# Patient Record
Sex: Female | Born: 1985 | Race: White | Hispanic: No | Marital: Single | Smoking: Never smoker
Health system: Southern US, Community
[De-identification: ages and names within clinical notes are randomized; demographics above are authoritative.]

## PROBLEM LIST (undated history)

## (undated) DIAGNOSIS — I10 Essential (primary) hypertension: Secondary | ICD-10-CM

## (undated) DIAGNOSIS — F99 Mental disorder, not otherwise specified: Secondary | ICD-10-CM

## (undated) DIAGNOSIS — G709 Myoneural disorder, unspecified: Secondary | ICD-10-CM

## (undated) DIAGNOSIS — O24419 Gestational diabetes mellitus in pregnancy, unspecified control: Secondary | ICD-10-CM

## (undated) DIAGNOSIS — R569 Unspecified convulsions: Secondary | ICD-10-CM

## (undated) DIAGNOSIS — F329 Major depressive disorder, single episode, unspecified: Secondary | ICD-10-CM

## (undated) DIAGNOSIS — F419 Anxiety disorder, unspecified: Secondary | ICD-10-CM

## (undated) DIAGNOSIS — F32A Depression, unspecified: Secondary | ICD-10-CM

## (undated) HISTORY — PX: HEMORRHOIDECTOMY WITH HEMORRHOID BANDING: SHX5633

---

## 2012-07-02 ENCOUNTER — Other Ambulatory Visit (HOSPITAL_COMMUNITY): Payer: Self-pay | Admitting: Obstetrics and Gynecology

## 2012-07-24 ENCOUNTER — Ambulatory Visit (HOSPITAL_COMMUNITY): Payer: Self-pay

## 2012-07-24 ENCOUNTER — Other Ambulatory Visit (HOSPITAL_COMMUNITY): Payer: Self-pay

## 2012-07-24 ENCOUNTER — Encounter (HOSPITAL_COMMUNITY): Payer: Self-pay | Admitting: Obstetrics and Gynecology

## 2012-07-24 ENCOUNTER — Encounter (HOSPITAL_COMMUNITY): Payer: Self-pay

## 2012-07-27 ENCOUNTER — Other Ambulatory Visit (HOSPITAL_COMMUNITY): Payer: Self-pay

## 2012-07-27 ENCOUNTER — Encounter (HOSPITAL_COMMUNITY): Payer: Self-pay

## 2012-08-01 ENCOUNTER — Ambulatory Visit (HOSPITAL_COMMUNITY)
Admission: RE | Admit: 2012-08-01 | Discharge: 2012-08-01 | Disposition: A | Discharge status: Home / Self Care | Payer: Medicaid Other | Source: Ambulatory Visit | Attending: Obstetrics and Gynecology | Admitting: Obstetrics and Gynecology

## 2012-08-01 ENCOUNTER — Encounter (HOSPITAL_COMMUNITY): Payer: Self-pay

## 2012-08-01 VITALS — BP 138/92 | HR 129 | Wt 120.5 lb

## 2012-08-01 DIAGNOSIS — Z1389 Encounter for screening for other disorder: Secondary | ICD-10-CM | POA: Insufficient documentation

## 2012-08-01 DIAGNOSIS — Z363 Encounter for antenatal screening for malformations: Secondary | ICD-10-CM | POA: Insufficient documentation

## 2012-08-01 DIAGNOSIS — O358XX0 Maternal care for other (suspected) fetal abnormality and damage, not applicable or unspecified: Secondary | ICD-10-CM | POA: Insufficient documentation

## 2012-08-01 DIAGNOSIS — O10019 Pre-existing essential hypertension complicating pregnancy, unspecified trimester: Secondary | ICD-10-CM | POA: Insufficient documentation

## 2012-08-01 DIAGNOSIS — O352XX0 Maternal care for (suspected) hereditary disease in fetus, not applicable or unspecified: Secondary | ICD-10-CM | POA: Insufficient documentation

## 2012-08-01 DIAGNOSIS — Q8500 Neurofibromatosis, unspecified: Secondary | ICD-10-CM

## 2012-08-01 DIAGNOSIS — O10912 Unspecified pre-existing hypertension complicating pregnancy, second trimester: Secondary | ICD-10-CM

## 2012-08-01 NOTE — Consult Note (Signed)
MFM consult  27 yr old G1P0 at [redacted]w[redacted]d with neurofibromatosis and chronic hypertension referred by Dr. Lamona Curl for fetal anatomic survey and consult.   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 Posterior placenta without evidence of previa. Normal amniotic fluid volume. Normal transabdominal cervical length. Normal fetal anatomic survey.    I counseled the patient as follows:   1. Appropriate fetal growth.  2. Normal fetal anatomic survey.  3. Chronic hypertension:  I discussed that women with preexistent hypertension are at increased risk of adverse pregnancy outcome, including preeclampsia, abruption, fetal growth restriction, and perinatal death. The risk increases with severity of hypertension and presence of end-organ damage. Risk of superimposed preeclampsia is 10 to 25 %; risk of abruption is 0.7 to 1.5 %; and risk of fetal growth restriction is 8 to 16 %. I also discussed that risk of preterm delivery is increased and is usually iatrogenic from the complications listed above. Risk of preterm delivery in women with chronic hypertension is 12-34%. There is also an increased risk of requiring C section for the above reasons.  Patient' hypertension is currently managed with without medication. I would recommend targeting therapy to keep systolic blood pressures <150 and diastolic blood pressures <100. If medical management is needed would recommend starting labetalol which is a category C but is considered safe in pregnancy although may have an increased risk of fetal growth restriction.  I recommend obtaining baseline studies: EKG, 24 hour urine for total protein, CBC, AST, ALT, BUN, creatinine, and uric acid.  I recommend serial ultrasounds for fetal growth every 4 weeks  starting at [redacted] weeks gestation.  I recommend close surveillance for the development of signs/symptoms of preeclampsia especially in the third trimester.  I recommend antenatal testing to start at [redacted] weeks gestation.  Recommend delivery by estimated due date but not prior to 38 weeks in the absence of other complications.  4. Neurofibromatosis:  - patient met with genetic counselor- to discuss inheritance; see separate report  - discussed there is some data that women with neurofibromatosis are at increased risk of the following: growth of neurofibromas, gestational hypertension, preeclampsia, fetal growth restriction, preterm labor, and cerebrovascular disease  - recommend fetal growth as above  - recommend surveillance for signs/symptoms of preterm labor and preeclampsia and signs of cerebrovascular disease  - recommend Anesthesia consult in the third trimester as some patients have neurofibromas on their spine or have kyphoscoliosis and this may interfere with regional anesthesia   I spent 30 minutes in face to face consultation with the patient in addition to time spent on the ultrasound.  Eulis Foster, MD

## 2012-08-01 NOTE — Progress Notes (Signed)
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  Ms. Manuele mother attended the genetic counseling appointment today and provided some of the medical and family history.  Ms. Kopp reported that she was diagnosed with NF1 as a teenager following a routine physical examination.  She stated that she has never been formally evaluated by a medical geneticist and has not had routine care for condition.  Ms. Vandekamp began showing cutaneous features of NF1 during childhood.  She has had one MRI, during childhood, secondary to headaches, but has not had any imaging since that time.  She is unaware of the results of her MRI.  She reported that she is very self conscious of her physical appearance, and stated that she was unhappy regarding the referral for genetic counseling.  She was given the option not to attend the appointment today, but elected to proceed with the appointment.  Although medical records were not available to verify Ms. Schoon's history, she and her mother have obvious cutaneous features of NF1.  She understands that the risk assessment provided today is based on the assumption that she has NF1 and not some other condition, which shares overlapping clinical features.    She was counseled that NF1 is an autosomal dominant genetic condition, occurring in approximately 1 in 3000 individuals.  NF1 is characterized by a combination of neurocutaneous and skeletal features.  The majority of individuals are diagnosed based on clinical manifestations that include cafe-au-lait macules, distinctive freckling patterns, and cutaneous neurofibromas.  Other  diagnostic criteria include Lisch nodules, skeletal abnormalities, optic nerve pathway tumors, and plexiform neurofibromas.  We reviewed that although NF1 is a fully penetrant condition, there is significant variability in clinical expression and many of the clinical findings have an age-related penetrance.  She was counseled that approximately 50% of individuals with NF1 have a de novo gene mutation, while the other 50% have inherited the gene alteration.    We reviewed Ms. Kinne's family history in detail.  She reported that her mother, her maternal uncle, her maternal grandmother, and many other extended family members also have NF1.  To her knowledge, none of these relatives have had a formal evaluation by a geneticist or genetic testing.  The remainder of the family history was noncontributory for birth defects, mental retardation, and known genetic conditions.  Further genetic counseling is warranted if more information is obtained.  Given that NF1 is an autosomal dominant condition, we discussed that there is a 50% chance with each pregnancy for Ms. Futch to have a child with NF1 also.  However, due to the widely variable expression of the condition, Ms. Meditz's child could have a clinical course similar to her own, or he could be more or less severe.  She was counseled that genetic testing is available for NF1 and that at least 90% of persons with NF1 will have a detectable change in the NF1 gene.  We discussed that if she opted to have genetic testing, the pregnancy could also be tested to determine if she was carrying an affected fetus.  Ms. Cryan declined the option of genetic testing for herself.  She understands that without knowing the familial gene alteration in this family, genetic  testing during the current pregnancy would not be fully informative. She expressed that she is not interested in prenatal diagnosis for NF1 at this time.  She understands that ultrasound cannot detect or diagnose  NF1.  Although most pregnancies in women with NF1 are normal, serious complications can occur.  Many women with NF1 experience a rapid increase in the number and size of neurofibromas during pregnancy.  Preexisting hypertension may be greatly exacerbated during pregnancy.  Large pelvic or genital neurofibromas can complicate delivery and cesarean section may be necessary more often in pregnant women with NF1 as compared to other women.  Ms. Collard had a separate consultation with Dr. Vincenza Hews today, a maternal fetal medicine specialist.  That note will be documented separately.  We discussed the availability of Ms. Ytuarte and/or her baby having a consultation with a medical geneticist.  I explained that a medical geneticist can help coordinate the appropriate care necessary for individuals who have NF1 (including referrals for MRI and visits to ophthalmology, neurology, etc).  She expressed that she is not interested in pursuing a consultation at this time.  She was urged to contact me if she is interested in scheduling an appointment in the future.      Ms. Girdner was provided with written information regarding cystic fibrosis (CF) including the carrier frequency and incidence in the Caucasian population, the availability of carrier testing and prenatal diagnosis if indicated.  In addition, we discussed that CF is routinely screened for as part of the [place] newborn screening panel.  She declined testing today.   Ms. Amy denied exposure to environmental toxins or chemical agents. She denied the use of alcohol, tobacco or street drugs. She denied significant viral illnesses during the course of her pregnancy.   I counseled Ms. Strohm and her mother regarding the above risks and available options.  The approximate face-to-face time with the genetic counselor was 40 minutes.  Donald Prose, MS Certified Genetic Counselor

## 2012-08-01 NOTE — Progress Notes (Signed)
[facility]  Indication: 27 yr old G1P0 at [redacted]w[redacted]d with neurofibromatosis and chronic hypertension for fetal anatomic survey.  Findings: 1. Single intrauterine pregnancy. 2. Fetal biometry is consistent with dating. 3. Posterior placenta without evidence of previa. 4. Normal amniotic fluid volume. 5. Normal transabdominal cervical length. 6. Normal fetal anatomic survey.  Recommendations: 1. Appropriate fetal growth. 2. Normal fetal anatomic survey. 3. Chronic hypertension: I discussed that women with preexistent hypertension are at increased risk of adverse pregnancy outcome, including preeclampsia, abruption, fetal growth restriction, and perinatal death. The risk increases with severity of hypertension and presence of end-organ damage. Risk of superimposed preeclampsia is 10 to 25 %; risk of abruption is 0.7 to 1.5 %; and risk of fetal growth restriction is 8 to 16 %. I also discussed that risk of preterm delivery is increased and is usually iatrogenic from the complications listed above. Risk of preterm delivery in women with chronic hypertension is 12-34%. There is also an increased risk of requiring C section for the above reasons. Patient' hypertension is currently managed with without medication. I would recommend targeting therapy to keep systolic blood pressures <150 and diastolic blood pressures <100. If medical management is needed would recommend starting labetalol which is a category C but is considered safe in pregnancy although may have an increased risk of fetal growth restriction. I recommend obtaining baseline studies: EKG, 24 hour urine for total protein, CBC, AST, ALT, BUN, creatinine, and uric acid. I recommend serial ultrasounds for fetal growth every 4 weeks starting at [redacted] weeks gestation. I recommend close surveillance for the development of signs/symptoms of preeclampsia especially in the third trimester. I recommend antenatal testing to start at  [redacted] weeks gestation. Recommend delivery by estimated due date but not prior to 38 weeks in the absence of other complications. 4. Neurofibromatosis: - patient met with genetic counselor- to discuss inheritance; see separate report - discussed there is some data that women with neurofibromatosis are at increased risk of the following: growth of neurofibromas, gestational hypertension, preeclampsia, fetal growth restriction, preterm labor, and cerebrovascular disease - recommend fetal growth as above - recommend surveillance for signs/symptoms of preterm labor and preeclampsia and signs of cerebrovascular disease - recommend Anesthesia consult in the third trimester as some patients have neurofibromas on their spine or have kyphoscoliosis and this may interfere with regional anesthesia  Eulis Foster, MD

## 2012-08-29 ENCOUNTER — Ambulatory Visit (HOSPITAL_COMMUNITY)
Admission: RE | Admit: 2012-08-29 | Discharge: 2012-08-29 | Disposition: A | Discharge status: Home / Self Care | Payer: Medicaid Other | Source: Ambulatory Visit | Attending: Obstetrics and Gynecology | Admitting: Obstetrics and Gynecology

## 2012-08-29 DIAGNOSIS — Q8500 Neurofibromatosis, unspecified: Secondary | ICD-10-CM

## 2012-08-29 DIAGNOSIS — O352XX0 Maternal care for (suspected) hereditary disease in fetus, not applicable or unspecified: Secondary | ICD-10-CM | POA: Insufficient documentation

## 2012-08-29 DIAGNOSIS — O10019 Pre-existing essential hypertension complicating pregnancy, unspecified trimester: Secondary | ICD-10-CM | POA: Insufficient documentation

## 2012-08-29 DIAGNOSIS — O10912 Unspecified pre-existing hypertension complicating pregnancy, second trimester: Secondary | ICD-10-CM

## 2012-09-25 ENCOUNTER — Other Ambulatory Visit (HOSPITAL_COMMUNITY): Payer: Self-pay | Admitting: Obstetrics and Gynecology

## 2012-09-25 DIAGNOSIS — Q8500 Neurofibromatosis, unspecified: Secondary | ICD-10-CM

## 2012-09-26 ENCOUNTER — Ambulatory Visit (HOSPITAL_COMMUNITY)
Admission: RE | Admit: 2012-09-26 | Discharge: 2012-09-26 | Disposition: A | Discharge status: Home / Self Care | Payer: Medicaid Other | Source: Ambulatory Visit | Attending: Obstetrics and Gynecology | Admitting: Obstetrics and Gynecology

## 2012-09-26 ENCOUNTER — Encounter (HOSPITAL_COMMUNITY): Payer: Self-pay

## 2012-09-26 DIAGNOSIS — Q8500 Neurofibromatosis, unspecified: Secondary | ICD-10-CM

## 2012-09-26 DIAGNOSIS — O352XX0 Maternal care for (suspected) hereditary disease in fetus, not applicable or unspecified: Secondary | ICD-10-CM | POA: Insufficient documentation

## 2012-09-26 DIAGNOSIS — O10019 Pre-existing essential hypertension complicating pregnancy, unspecified trimester: Secondary | ICD-10-CM | POA: Insufficient documentation

## 2012-09-26 NOTE — Progress Notes (Signed)
Kristy Campbell  was seen today for an ultrasound appointment.  See full report in [facility].  Impression: Single IUP at 27 3/7 weeks Follow up due to Neurofibromatosis (type I) Normal interval anatomy Fetal growth is appropriate (31st %tile) Normal amniotic fluid volume  Recommendations: Recommend follow up for interval fetal growth in 4 weeks. Anesthesia consult in the third trimeser due to neurofibromatosis (due to associated kyphoscoliosis - to discuss regional anesthesia)  Alpha Gula, MD

## 2012-10-22 ENCOUNTER — Other Ambulatory Visit (HOSPITAL_COMMUNITY): Payer: Self-pay | Admitting: Obstetrics and Gynecology

## 2012-10-22 DIAGNOSIS — O352XX0 Maternal care for (suspected) hereditary disease in fetus, not applicable or unspecified: Secondary | ICD-10-CM

## 2012-10-24 ENCOUNTER — Encounter (HOSPITAL_COMMUNITY): Payer: Self-pay

## 2012-10-24 ENCOUNTER — Ambulatory Visit (HOSPITAL_COMMUNITY)
Admission: RE | Admit: 2012-10-24 | Discharge: 2012-10-24 | Disposition: A | Discharge status: Home / Self Care | Payer: Medicaid Other | Source: Ambulatory Visit | Attending: Obstetrics and Gynecology | Admitting: Obstetrics and Gynecology

## 2012-10-24 VITALS — BP 147/93 | HR 108 | Wt 130.2 lb

## 2012-10-24 DIAGNOSIS — O10019 Pre-existing essential hypertension complicating pregnancy, unspecified trimester: Secondary | ICD-10-CM | POA: Insufficient documentation

## 2012-10-24 DIAGNOSIS — O99810 Abnormal glucose complicating pregnancy: Secondary | ICD-10-CM | POA: Insufficient documentation

## 2012-10-24 DIAGNOSIS — O352XX0 Maternal care for (suspected) hereditary disease in fetus, not applicable or unspecified: Secondary | ICD-10-CM | POA: Insufficient documentation

## 2012-10-24 DIAGNOSIS — O10013 Pre-existing essential hypertension complicating pregnancy, third trimester: Secondary | ICD-10-CM

## 2012-11-21 ENCOUNTER — Ambulatory Visit (HOSPITAL_COMMUNITY)
Admission: RE | Admit: 2012-11-21 | Discharge: 2012-11-21 | Disposition: A | Discharge status: Home / Self Care | Payer: Medicaid Other | Source: Ambulatory Visit | Attending: Obstetrics and Gynecology | Admitting: Obstetrics and Gynecology

## 2012-11-21 ENCOUNTER — Other Ambulatory Visit (HOSPITAL_COMMUNITY): Payer: Self-pay | Admitting: Maternal and Fetal Medicine

## 2012-11-21 VITALS — BP 141/89 | HR 113 | Wt 134.5 lb

## 2012-11-21 DIAGNOSIS — O365931 Maternal care for other known or suspected poor fetal growth, third trimester, fetus 1: Secondary | ICD-10-CM

## 2012-11-21 DIAGNOSIS — O352XX0 Maternal care for (suspected) hereditary disease in fetus, not applicable or unspecified: Secondary | ICD-10-CM

## 2012-11-21 DIAGNOSIS — O10013 Pre-existing essential hypertension complicating pregnancy, third trimester: Secondary | ICD-10-CM

## 2012-11-21 DIAGNOSIS — O99810 Abnormal glucose complicating pregnancy: Secondary | ICD-10-CM

## 2012-11-21 DIAGNOSIS — O10019 Pre-existing essential hypertension complicating pregnancy, unspecified trimester: Secondary | ICD-10-CM | POA: Insufficient documentation

## 2012-11-21 NOTE — Anesthesia Preprocedure Evaluation (Addendum)
Anesthesia Evaluation  Patient identified by MRN, date of birth, ID band Patient awake    Reviewed: Allergy & Precautions, H&P , Patient's Chart, lab work & pertinent test results  Airway Mallampati: II TM Distance: >3 FB Neck ROM: full    Dental no notable dental hx.    Pulmonary neg pulmonary ROS,  breath sounds clear to auscultation  Pulmonary exam normal       Cardiovascular hypertension, negative cardio ROS  Rhythm:regular Rate:Normal     Neuro/Psych PSYCHIATRIC DISORDERS Anxiety Depression neurofibromatosis  Neuromuscular disease negative neurological ROS  negative psych ROS   GI/Hepatic negative GI ROS, Neg liver ROS,   Endo/Other  negative endocrine ROSdiabetes  Renal/GU negative Renal ROS     Musculoskeletal   Abdominal   Peds  Hematology negative hematology ROS (+)   Anesthesia Other Findings Neurofibromatosis- MRI from outside hospital describes multiple lesions along lumbar canal.    Reproductive/Obstetrics (+) Pregnancy                          Anesthesia Physical Anesthesia Plan  ASA: III  Anesthesia Plan: Epidural   Post-op Pain Management:    Induction:   Airway Management Planned:   Additional Equipment:   Intra-op Plan:   Post-operative Plan:   Informed Consent: I have reviewed the patients History and Physical, chart, labs and discussed the procedure including the risks, benefits and alternatives for the proposed anesthesia with the patient or authorized representative who has indicated his/her understanding and acceptance.     Plan Discussed with:   Anesthesia Plan Comments:        I was asked whether or not we would feel comfortable taking care of this patient at [facility]. I was told that the Anesthesiologists at [facility] do not feel comfortable with taking care of her.   I think that we can offer safe care here, but I do not think spinal  anesthesia is a viable option here at [facility].  I explained that there may be more willingness at an academic center for placement of epidural or spinal anesthesia.  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 He describes most of the disease as intradural.  This probably makes spinal anesthesia a more risky procedure and in my estimation the risks outweigh the benefits, so I would probably use a GA or Epidural for C/S.  Regarding epidural, the radiologist feels that the epidural space in the lumbar region is free of disease and that the approach from midline would be safe.  I spent 20 minutes speaking with the patient.  I assured her that we could offer her safe care and that, while I could not guarantee that all of the MDAs would feel comfortable placing an epidural, I thought that most likely she could have an epidural here at [facility].  She understands the risks and benefits.  All of her questions were answered. Anesthesia Quick Evaluation

## 2012-11-21 NOTE — Progress Notes (Signed)
Kristy Campbell  was seen today for an ultrasound appointment.  See full report in [facility].  Comments: Kristy Campbell returns for follow up ultrasound for interval fetal growth.  She recently underwent MRI to evaluate the maternal spine due to a history of Neurofibromatosis.  Kristy Campbell met with anesthesia today.  After discussion with the neuroradiologist, anesthesia is willing to attempt epidural anestesia but not spinal anesthesia (in the event that Cesarean delivery is required).  Based on this, the patient plans to transfer care for delivery at [facility].  Ultrasound today reveals and estimated fetal weight of < 10th %tile.  UA Dopplers are normal for gestational age.  The BPP is 8/8. Although there is likely an element of constitutional growth given maternal size, cannot rule out other etiology.  Of note, her BPs today were 141/89.  Based on the new finding of fetal growth restriction and elevated BPs, recommend a preeclampsia work up and 24-hr urine protein.  The patient will go to L&D in [place] for further evaluation today.  Impression: Single IUP at 35 3/7 weeks The estimated fetal weight today is at the 10th %tile.  The [facility] measures at the 9th %tile. UA Dopplers normal for gestational age. BPP 8/8 Normal amniotic fluid volume  Recommendations: Recommend 2x weekly antepartum fetal testing.   Patient will have NST on Monday with her referring clinic.  Will return for BPP and UA Dopplers next week with MFM.  Patient to be further evaluated on L&D for preeclampsia  Will make arrangements to transfer care to Faculty practice [facility] at next visit.  If testing otherwise reassuring, would recommend delivery at 37-38 weeks due to suspected fetal growth restriction.  Alpha Gula, MD

## 2012-11-22 ENCOUNTER — Telehealth: Payer: Self-pay | Admitting: Obstetrics and Gynecology

## 2012-11-22 NOTE — Telephone Encounter (Signed)
MFM Marylene Land) called down for clinic to make an appt for this patient as a transfer OB from [place]. Patient wants to deliver here. Appt made and advised patient of this and to get records from [facility] clinic to send to [place]. Patient agrees and satisfied.

## 2012-11-29 ENCOUNTER — Ambulatory Visit (HOSPITAL_COMMUNITY)
Admission: RE | Admit: 2012-11-29 | Discharge: 2012-11-29 | Disposition: A | Discharge status: Home / Self Care | Payer: Medicaid Other | Source: Ambulatory Visit | Attending: Obstetrics and Gynecology | Admitting: Obstetrics and Gynecology

## 2012-11-29 ENCOUNTER — Encounter (HOSPITAL_COMMUNITY): Payer: Self-pay | Admitting: *Deleted

## 2012-11-29 ENCOUNTER — Inpatient Hospital Stay (HOSPITAL_COMMUNITY)
Admission: AD | Admit: 2012-11-29 | Discharge: 2012-12-03 | DRG: 765 | Disposition: A | Discharge status: Home / Self Care | Payer: Medicaid Other | Source: Ambulatory Visit | Attending: Obstetrics and Gynecology | Admitting: Obstetrics and Gynecology

## 2012-11-29 DIAGNOSIS — O4100X0 Oligohydramnios, unspecified trimester, not applicable or unspecified: Principal | ICD-10-CM | POA: Diagnosis present

## 2012-11-29 DIAGNOSIS — O365990 Maternal care for other known or suspected poor fetal growth, unspecified trimester, not applicable or unspecified: Secondary | ICD-10-CM | POA: Diagnosis present

## 2012-11-29 DIAGNOSIS — O1002 Pre-existing essential hypertension complicating childbirth: Secondary | ICD-10-CM | POA: Diagnosis present

## 2012-11-29 DIAGNOSIS — O352XX0 Maternal care for (suspected) hereditary disease in fetus, not applicable or unspecified: Secondary | ICD-10-CM | POA: Insufficient documentation

## 2012-11-29 DIAGNOSIS — O365931 Maternal care for other known or suspected poor fetal growth, third trimester, fetus 1: Secondary | ICD-10-CM

## 2012-11-29 DIAGNOSIS — O99810 Abnormal glucose complicating pregnancy: Secondary | ICD-10-CM | POA: Insufficient documentation

## 2012-11-29 DIAGNOSIS — O99814 Abnormal glucose complicating childbirth: Secondary | ICD-10-CM | POA: Diagnosis present

## 2012-11-29 DIAGNOSIS — Z98891 History of uterine scar from previous surgery: Secondary | ICD-10-CM

## 2012-11-29 DIAGNOSIS — O10019 Pre-existing essential hypertension complicating pregnancy, unspecified trimester: Secondary | ICD-10-CM | POA: Insufficient documentation

## 2012-11-29 HISTORY — DX: Myoneural disorder, unspecified: G70.9

## 2012-11-29 HISTORY — DX: Major depressive disorder, single episode, unspecified: F32.9

## 2012-11-29 HISTORY — DX: Gestational diabetes mellitus in pregnancy, unspecified control: O24.419

## 2012-11-29 HISTORY — DX: Mental disorder, not otherwise specified: F99

## 2012-11-29 HISTORY — DX: Essential (primary) hypertension: I10

## 2012-11-29 HISTORY — DX: Anxiety disorder, unspecified: F41.9

## 2012-11-29 HISTORY — DX: Depression, unspecified: F32.A

## 2012-11-29 LAB — CBC
HCT: 40.3 % (ref 36.0–46.0)
MCHC: 35.7 g/dL (ref 30.0–36.0)
Platelets: 250 10*3/uL (ref 150–400)
RDW: 12.5 % (ref 11.5–15.5)
WBC: 12.3 10*3/uL — ABNORMAL HIGH (ref 4.0–10.5)

## 2012-11-29 LAB — OB RESULTS CONSOLE HEPATITIS B SURFACE ANTIGEN: Hepatitis B Surface Ag: NEGATIVE

## 2012-11-29 LAB — OB RESULTS CONSOLE RUBELLA ANTIBODY, IGM: Rubella: IMMUNE

## 2012-11-29 LAB — GLUCOSE, CAPILLARY: Glucose-Capillary: 85 mg/dL (ref 70–99)

## 2012-11-29 LAB — OB RESULTS CONSOLE HIV ANTIBODY (ROUTINE TESTING): HIV: NONREACTIVE

## 2012-11-29 LAB — OB RESULTS CONSOLE GC/CHLAMYDIA
Chlamydia: NEGATIVE
Gonorrhea: NEGATIVE

## 2012-11-29 LAB — OB RESULTS CONSOLE ABO/RH

## 2012-11-29 MED ORDER — ONDANSETRON HCL 4 MG/2ML IJ SOLN: 4.0000 mg | Freq: Four times a day (QID) | INTRAMUSCULAR | Status: DC | PRN

## 2012-11-29 MED ORDER — CITRIC ACID-SODIUM CITRATE 334-500 MG/5ML PO SOLN
30.0000 mL | ORAL | Status: DC | PRN
  Administered 2012-12-01: 30 mL via ORAL
  Filled 2012-11-29: qty 15

## 2012-11-29 MED ORDER — LACTATED RINGERS IV SOLN
500.0000 mL | INTRAVENOUS | Status: DC | PRN
  Administered 2012-12-01 (×2): 500 mL via INTRAVENOUS

## 2012-11-29 MED ORDER — IBUPROFEN 600 MG PO TABS
600.0000 mg | ORAL_TABLET | Freq: Four times a day (QID) | ORAL | Status: DC | PRN
  Administered 2012-11-29: 600 mg via ORAL
  Filled 2012-11-29: qty 1

## 2012-11-29 MED ORDER — MISOPROSTOL 25 MCG QUARTER TABLET: 25.0000 ug | ORAL_TABLET | ORAL | Status: DC | PRN

## 2012-11-29 MED ORDER — OXYTOCIN 40 UNITS IN LACTATED RINGERS INFUSION - SIMPLE MED: 62.5000 mL/h | INTRAVENOUS | Status: DC

## 2012-11-29 MED ORDER — LACTATED RINGERS IV SOLN
INTRAVENOUS | Status: DC
  Administered 2012-11-29 – 2012-11-30 (×4): via INTRAVENOUS
  Administered 2012-12-01: 1000 mL via INTRAVENOUS
  Administered 2012-12-01: 01:00:00 via INTRAVENOUS

## 2012-11-29 MED ORDER — ESCITALOPRAM OXALATE 10 MG PO TABS
10.0000 mg | ORAL_TABLET | Freq: Every day | ORAL | Status: DC
  Filled 2012-11-29 (×2): qty 1

## 2012-11-29 MED ORDER — ZOLPIDEM TARTRATE 5 MG PO TABS
5.0000 mg | ORAL_TABLET | Freq: Every evening | ORAL | Status: DC | PRN
  Administered 2012-11-30: 5 mg via ORAL
  Filled 2012-11-29: qty 1

## 2012-11-29 MED ORDER — ACETAMINOPHEN 325 MG PO TABS: 650.0000 mg | ORAL_TABLET | ORAL | Status: DC | PRN

## 2012-11-29 MED ORDER — OXYCODONE-ACETAMINOPHEN 5-325 MG PO TABS: 1.0000 | ORAL_TABLET | ORAL | Status: DC | PRN

## 2012-11-29 MED ORDER — MISOPROSTOL 25 MCG QUARTER TABLET
25.0000 ug | ORAL_TABLET | ORAL | Status: DC | PRN
  Administered 2012-11-29 – 2012-11-30 (×4): 25 ug via VAGINAL
  Filled 2012-11-29 (×4): qty 0.25

## 2012-11-29 MED ORDER — OXYTOCIN BOLUS FROM INFUSION: 500.0000 mL | INTRAVENOUS | Status: DC

## 2012-11-29 MED ORDER — LIDOCAINE HCL (PF) 1 % IJ SOLN: 30.0000 mL | INTRAMUSCULAR | Status: DC | PRN

## 2012-11-29 NOTE — Progress Notes (Addendum)
   Kristy Campbell is a 27 y.o. G1P0000 at [redacted]w[redacted]d  admitted for induction of labor due to Low amniotic fluid..  Subjective:  Feels "annoyingly crampy" Objective: BP 119/69  Pulse 94  Temp(Src) 98.4 F (36.9 C) (Oral)  Resp 18  Ht 4\' 11"  (1.499 m)  Wt 62.143 kg (137 lb)  BMI 27.66 kg/m2  LMP 03/18/2012    FHT:  140bpm, avg LTV, + accels, no decels UC:   irregular, every 2-5 minutes SVE:   Dilation: Closed Effacement (%): Thick Station: Ballotable Exam by:: Dr. Delford Field   Labs: Lab Results  Component Value Date   WBC 12.3* 11/29/2012   HGB 14.4 11/29/2012   HCT 40.3 11/29/2012   MCV 84.8 11/29/2012   PLT 250 11/29/2012    Assessment / Plan: IOL for oligohydramnios/ IUGR  WIll place 3rd cytotec when due Labor: ripening phase Fetal Wellbeing:  Category I Pain Control:  Labor support without medications Anticipated MOD:  NSVD  CRESENZO-DISHMAN,Shakeya Kerkman 11/29/2012, 11:44 PM

## 2012-11-29 NOTE — Progress Notes (Signed)
   Kristy Campbell is a 27 y.o. G1P0000 at [redacted]w[redacted]d  admitted for induction of labor due to Low amniotic fluid..  Subjective: No c/o  Objective: BP 125/83  Pulse 82  Temp(Src) 98.4 F (36.9 C) (Oral)  Resp 18  Ht 4\' 11"  (1.499 m)  Wt 62.143 kg (137 lb)  BMI 27.66 kg/m2  LMP 03/18/2012    FHT:  FHR: 150 bpm, variability: moderate,  accelerations:  Present,  decelerations:  Absent UC:   irregular, every 2-5 minutes SVE:   Dilation: Closed Effacement (%): Thick Station: Ballotable Exam by:: Bertram Millard, RN   Labs: Lab Results  Component Value Date   WBC 12.3* 11/29/2012   HGB 14.4 11/29/2012   HCT 40.3 11/29/2012   MCV 84.8 11/29/2012   PLT 250 11/29/2012    Assessment / Plan: IOL for oligohydramnios/ IUGR;  2nd cytotec placed  Labor: ripening phase Fetal Wellbeing:  Category I Pain Control:  Labor support without medications Anticipated MOD:  NSVD  CRESENZO-DISHMAN,Quoc Tome 11/29/2012, 9:25 PM

## 2012-11-29 NOTE — H&P (Signed)
Attestation of Attending Supervision of Advanced Practitioner (PA/CNM/NP): Evaluation and management procedures were performed by the Advanced Practitioner under my supervision and collaboration.  I have reviewed the Advanced Practitioner's note and chart, and I agree with the management and plan.  Marca Gadsby, MD, FACOG Attending Obstetrician & Gynecologist Faculty Practice, [facility]  

## 2012-11-29 NOTE — H&P (Signed)
Kristy Campbell is a 27 y.o. female presenting for induction of labor due to oligohydramnios and suspected fetal growth restriction.  Pt received prenatal care at [facility]   (need to obtain lab and ultrasound results).  Pregnancy complicated by chronic hypertension and gestational diabetes.  Pt also has a medical history of neurofibromatosis.  Previously seen by Dr. Neale Burly regarding plan for pain management due to condition. (see notes).    History OB History   Grav Para Term Preterm Abortions TAB SAB Ect Mult Living   1 0 0 0 0 0 0 0 0 0      Past Medical History  Diagnosis Date  . Mental disorder   . Anxiety   . Depression   . Neuromuscular disorder   . Hypertension     Dx 22  . Gestational diabetes    History reviewed. No pertinent past surgical history. Family History: family history is not on file. Social History:  reports that she has never smoked. She does not have any smokeless tobacco history on file. She reports that she does not drink alcohol or use illicit drugs.  Review of Systems  Constitutional: Positive for fever. Negative for chills.  Eyes: Negative for blurred vision.  Cardiovascular: Negative for chest pain.  Gastrointestinal: Negative for abdominal pain.  Musculoskeletal:       Pedal swelling and pain  Neurological: Negative for headaches.  All other systems reviewed and are negative.      Blood pressure 133/91, pulse 112, temperature 98.6 F (37 C), temperature source Oral, resp. rate 20, height 4\' 11"  (1.499 m), weight 62.143 kg (137 lb), last menstrual period 03/18/2012. Maternal Exam:  Uterine Assessment: Contraction strength is mild.  Contraction frequency is irregular.   Abdomen: Estimated fetal weight is 5-5.5 lbs.   Fetal presentation: vertex  Introitus: Normal vulva. Normal vagina.  Vaginal discharge: mucusy.    Fetal Exam Fetal Monitor Review: Baseline rate: 130's.  Variability: moderate (6-25 bpm).   Pattern: accelerations  present.    Fetal State Assessment: Category I - tracings are normal.     Physical Exam  Constitutional: She is oriented to person, place, and time. She appears well-developed and well-nourished. No distress.  HENT:  Head: Normocephalic.  Neck: Normal range of motion. Neck supple.  Cardiovascular: Normal rate, regular rhythm and normal heart sounds.   Respiratory: Effort normal and breath sounds normal.  GI: Soft. There is no tenderness.  Genitourinary: No bleeding around the vagina. Vaginal discharge: mucusy.  Musculoskeletal: Normal range of motion. She exhibits edema (2+ bilat pedal).  Neurological: She is alert and oriented to person, place, and time.  Skin: Skin is warm and dry. Rash noted. Rash is papular (multiple lesions on body (neurofibromatosis)).    Prenatal labs: ABO, Rh:   Antibody:   Rubella:   RPR:    HBsAg:    HIV:    GBS:     Assessment/Plan: 27 yo G1P0 @ 36.4 wks IUP Chronic Hypertension Gestational Diabetes - Diet Controlled Neurofibromatosis  Plan: Admit to [employer]  Obtain prenatal labs and ultrasound results from [place] [facility] [place] and [facility] Consult with anesthesia regarding plan of care for pain management. Cytotec for induction of labor. GBS PCR   [facility] 11/29/2012, 2:39 PM

## 2012-11-29 NOTE — Progress Notes (Signed)
Kristy Campbell  was seen today for an ultrasound appointment.  See full report in [facility].  Impression: Single IUP at 36 4/7 week Hx of neurofibromatosis, suspected fetal growth restriction (< 10th %tile on 7/16) UA Dopplers normal for gestational age. BPP 8/10 (- 2 for oligohydramnios without a single 2x2cm pocket) Oligohydramnios  BP 134/91  Recommendations: Findings discussed with Dr. Macon Large She will review case with anesthesia - make arrangements for induction of labor tomorrow.  Alpha Gula, MD

## 2012-11-29 NOTE — Progress Notes (Signed)
Per Dr. Malen Gauze will follow plan of care indicated by Dr. Neale Burly (epidural or general anesthesia); avoid spinal.

## 2012-11-30 ENCOUNTER — Encounter (HOSPITAL_COMMUNITY): Payer: Self-pay | Admitting: Anesthesiology

## 2012-11-30 ENCOUNTER — Inpatient Hospital Stay (HOSPITAL_COMMUNITY): Payer: Medicaid Other | Admitting: Anesthesiology

## 2012-11-30 ENCOUNTER — Encounter (HOSPITAL_COMMUNITY): Payer: Self-pay | Admitting: *Deleted

## 2012-11-30 LAB — PROTEIN / CREATININE RATIO, URINE
Creatinine, Urine: 40.9 mg/dL
Protein Creatinine Ratio: 0.2 — ABNORMAL HIGH (ref 0.00–0.15)

## 2012-11-30 LAB — COMPREHENSIVE METABOLIC PANEL
BUN: 4 mg/dL — ABNORMAL LOW (ref 6–23)
Calcium: 9.3 mg/dL (ref 8.4–10.5)
GFR calc Af Amer: >90 mL/min (ref >90)
Glucose, Bld: 84 mg/dL (ref 70–99)
Sodium: 134 mEq/L — ABNORMAL LOW (ref 135–145)
Total Protein: 6.5 g/dL (ref 6.0–8.3)

## 2012-11-30 LAB — CBC
Platelets: 209 10*3/uL (ref 150–400)
RDW: 12.7 % (ref 11.5–15.5)
WBC: 11.7 10*3/uL — ABNORMAL HIGH (ref 4.0–10.5)

## 2012-11-30 LAB — GLUCOSE, CAPILLARY: Glucose-Capillary: 87 mg/dL (ref 70–99)

## 2012-11-30 MED ORDER — OXYTOCIN 40 UNITS IN LACTATED RINGERS INFUSION - SIMPLE MED
1.0000 m[IU]/min | INTRAVENOUS | Status: DC
  Administered 2012-11-30: 2 m[IU]/min via INTRAVENOUS
  Filled 2012-11-30: qty 1000

## 2012-11-30 MED ORDER — PENICILLIN G POTASSIUM 5000000 UNITS IJ SOLR
2.5000 10*6.[IU] | INTRAVENOUS | Status: DC
  Administered 2012-11-30 – 2012-12-01 (×4): 2.5 10*6.[IU] via INTRAVENOUS
  Filled 2012-11-30 (×7): qty 2.5

## 2012-11-30 MED ORDER — BUTORPHANOL TARTRATE 1 MG/ML IJ SOLN
INTRAMUSCULAR | Status: AC
  Filled 2012-11-30: qty 1

## 2012-11-30 MED ORDER — TERBUTALINE SULFATE 1 MG/ML IJ SOLN: 0.2500 mg | Freq: Once | INTRAMUSCULAR | Status: AC | PRN

## 2012-11-30 MED ORDER — PHENYLEPHRINE 40 MCG/ML (10ML) SYRINGE FOR IV PUSH (FOR BLOOD PRESSURE SUPPORT)
80.0000 ug | PREFILLED_SYRINGE | INTRAVENOUS | Status: DC | PRN
  Filled 2012-11-30: qty 5

## 2012-11-30 MED ORDER — PHENYLEPHRINE 40 MCG/ML (10ML) SYRINGE FOR IV PUSH (FOR BLOOD PRESSURE SUPPORT): 80.0000 ug | PREFILLED_SYRINGE | INTRAVENOUS | Status: DC | PRN

## 2012-11-30 MED ORDER — STERILE WATER FOR INJECTION IJ SOLN
0.2500 mL | INTRAMUSCULAR | Status: DC | PRN
Start: 2012-11-30 — End: 2012-12-01
  Filled 2012-11-30: qty 10

## 2012-11-30 MED ORDER — DIPHENHYDRAMINE HCL 50 MG/ML IJ SOLN: 12.5000 mg | INTRAMUSCULAR | Status: DC | PRN

## 2012-11-30 MED ORDER — EPHEDRINE 5 MG/ML INJ
10.0000 mg | INTRAVENOUS | Status: DC | PRN
  Filled 2012-11-30: qty 4

## 2012-11-30 MED ORDER — NALBUPHINE SYRINGE 5 MG/0.5 ML
10.0000 mg | INJECTION | Freq: Once | INTRAMUSCULAR | Status: AC
  Administered 2012-11-30: 10 mg via INTRAVENOUS
  Filled 2012-11-30: qty 1

## 2012-11-30 MED ORDER — BUTORPHANOL TARTRATE 1 MG/ML IJ SOLN
1.0000 mg | INTRAMUSCULAR | Status: DC | PRN
  Administered 2012-11-30: 1 mg via INTRAVENOUS

## 2012-11-30 MED ORDER — PROMETHAZINE HCL 25 MG/ML IJ SOLN
25.0000 mg | Freq: Once | INTRAMUSCULAR | Status: AC
  Administered 2012-11-30: 25 mg via INTRAMUSCULAR
  Filled 2012-11-30: qty 1

## 2012-11-30 MED ORDER — LIDOCAINE HCL (PF) 1 % IJ SOLN
INTRAMUSCULAR | Status: DC | PRN
  Administered 2012-11-30 (×2): 5 mL

## 2012-11-30 MED ORDER — DEXTROSE 5 % IV SOLN
5.0000 10*6.[IU] | Freq: Once | INTRAVENOUS | Status: AC
  Administered 2012-11-30: 5 10*6.[IU] via INTRAVENOUS
  Filled 2012-11-30: qty 5

## 2012-11-30 MED ORDER — NALBUPHINE SYRINGE 5 MG/0.5 ML
10.0000 mg | INJECTION | Freq: Once | INTRAMUSCULAR | Status: AC
  Administered 2012-11-30: 10 mg via INTRAMUSCULAR

## 2012-11-30 MED ORDER — CYCLOBENZAPRINE HCL 10 MG PO TABS
10.0000 mg | ORAL_TABLET | Freq: Three times a day (TID) | ORAL | Status: DC | PRN
  Administered 2012-11-30: 10 mg via ORAL
  Filled 2012-11-30: qty 1

## 2012-11-30 MED ORDER — FENTANYL 2.5 MCG/ML BUPIVACAINE 1/10 % EPIDURAL INFUSION (WH - ANES)
14.0000 mL/h | INTRAMUSCULAR | Status: DC | PRN
  Administered 2012-11-30: 14 mL/h via EPIDURAL
  Filled 2012-11-30: qty 125

## 2012-11-30 MED ORDER — EPHEDRINE 5 MG/ML INJ: 10.0000 mg | INTRAVENOUS | Status: DC | PRN

## 2012-11-30 MED ORDER — LACTATED RINGERS IV SOLN
500.0000 mL | Freq: Once | INTRAVENOUS | Status: AC
  Administered 2012-11-30: 500 mL via INTRAVENOUS

## 2012-11-30 NOTE — Progress Notes (Addendum)
Kristy Campbell is a 27 y.o. G1P0000 at [redacted]w[redacted]d admitted for induction of labor due to Low amniotic fluid..  Subjective: Patient having a lot of lower back pain, tired of laying in bed.  Objective: BP 128/73  Pulse 108  Temp(Src) 98.5 F (36.9 C) (Oral)  Resp 18  Ht 4\' 11"  (1.499 m)  Wt 62.143 kg (137 lb)  BMI 27.66 kg/m2  LMP 03/18/2012      FHT:  FHR: 130 bpm, variability: moderate,  accelerations:  Present,  decelerations:  Absent UC:   irregular, every 8 minutes SVE:   Dilation: 1 Effacement (%): 100 Station: -2 Exam by:: Artelia Laroche CNM  Labs: Lab Results  Component Value Date   WBC 11.7* 11/30/2012   HGB 14.2 11/30/2012   HCT 39.8 11/30/2012   MCV 85.4 11/30/2012   PLT 209 11/30/2012    Assessment / Plan: Induction of labor due to oligo, IUGR Outer foley bulb deflated, repositioned Will start pit 2x2, leave at 6. Epidural now  Labor: no Fetal Wellbeing:  Category I Pain Control:  epidural to be placed I/D:  penicillin Anticipated MOD:  NSVD  Tawni Carnes 11/30/2012, 10:41 PM  Examined by me Distal balloon was up in uterus, not near cervix. Proximal balloon deflated and catheter pulled back about 15-20cm until distal balloon was right up against cervix. Will start Low Dose Pitocin and let pt get epidural Anticipate better progress now.   Wynelle Bourgeois CNM

## 2012-11-30 NOTE — Progress Notes (Signed)
Offered pt sterile water papules to attempt to relieve her constant back pain/pressure.  Explained the premise of the procedure and answered her questions.  Will think about it and let this RN know.  Order received from MD for papules.

## 2012-11-30 NOTE — Progress Notes (Addendum)
   Kristy Campbell is a 27 y.o. G1P0000 at 106w5d  admitted for induction of labor due to oligohydramnios.  Subjective: "a little crampy"  Objective: BP 154/98  Pulse 93  Temp(Src) 98 F (36.7 C) (Oral)  Resp 18  Ht 4\' 11"  (1.499 m)  Wt 62.143 kg (137 lb)  BMI 27.66 kg/m2  LMP 03/18/2012    FHT:  FHR: 140 bpm, variability: moderate,  accelerations:  Present,  decelerations:  Absent UC:   irregular, every 2-6 minutes SVE:   Dilation: Fingertip Effacement (%): Thick Station: Ballotable Exam by:: SEarl RN  Labs: Lab Results  Component Value Date   WBC 12.3* 11/29/2012   HGB 14.4 11/29/2012   HCT 40.3 11/29/2012   MCV 84.8 11/29/2012   PLT 250 11/29/2012    Assessment / Plan: IOL for polyhydramnios, ripeining phase.  Progress made 4th cytotec placed Labor: no Fetal Wellbeing:  Category I Pain Control:  Labor support without medications Anticipated MOD:  NSVD  CRESENZO-DISHMAN,Kristy Campbell 11/30/2012, 8:07 AM

## 2012-11-30 NOTE — Anesthesia Procedure Notes (Signed)
Epidural Patient location during procedure: OB Start time: 11/30/2012 11:22 PM  Staffing Anesthesiologist: Angus Seller., Harrell Gave. Performed by: anesthesiologist   Preanesthetic Checklist Completed: patient identified, site marked, surgical consent, pre-op evaluation, timeout performed, IV checked, risks and benefits discussed and monitors and equipment checked  Epidural Patient position: sitting Prep: site prepped and draped and DuraPrep Patient monitoring: continuous pulse ox and blood pressure Approach: midline Injection technique: LOR air and LOR saline  Needle:  Needle type: Tuohy  Needle gauge: 17 G Needle length: 9 cm and 9 Needle insertion depth: 5 cm cm Catheter type: closed end flexible Catheter size: 19 Gauge Catheter at skin depth: 10 cm Test dose: negative  Assessment Events: blood not aspirated, injection not painful, no injection resistance, negative IV test and no paresthesia  Additional Notes Patient identified.  Risk benefits discussed including failed block, incomplete pain control, headache, nerve damage, paralysis, blood pressure changes, nausea, vomiting, reactions to medication both toxic or allergic, and postpartum back pain.  Patient expressed understanding and wished to proceed.  All questions were answered.  Sterile technique used throughout procedure and epidural site dressed with sterile barrier dressing. No paresthesia or other complications noted.The patient did not experience any signs of intravascular injection such as tinnitus or metallic taste in mouth nor signs of intrathecal spread such as rapid motor block. Please see nursing notes for vital signs.  Patient noted cramp-like sensation in left leg, needle redirected without incident

## 2012-11-30 NOTE — Progress Notes (Signed)
Dr Ike Bene updated on pt status.  Monitors off so pt can shower.  May have light breakfast.

## 2012-11-30 NOTE — Progress Notes (Signed)
Kristy Campbell is a 27 y.o. G1P0000 at [redacted]w[redacted]d admitted for induction of labor due to Gestational diabetes, Hypertension, Poor fetal growth and neurofibromatosis.  Subjective: Pt with increased pain with cytotec. Will give stadol. Reassuring strip at this time.  Objective: BP 154/98  Pulse 93  Temp(Src) 98 F (36.7 C) (Oral)  Resp 18  Ht 4\' 11"  (1.499 m)  Wt 137 lb (62.143 kg)  BMI 27.66 kg/m2  LMP 03/18/2012     Temp:  [98 F (36.7 C)-98.6 F (37 C)] 98 F (36.7 C) (07/25 0744) Pulse Rate:  [73-112] 81 (07/25 1043) Resp:  [16-20] 20 (07/25 1043) BP: (109-154)/(63-98) 120/93 mmHg (07/25 1043) Weight:  [137 lb (62.143 kg)] 137 lb (62.143 kg) (07/24 1345)   FHT:  FHR: 130s bpm, variability: moderate,  accelerations:  Present,  decelerations:  Present variables. UC:   Uterine irritability SVE:   Dilation: Fingertip Effacement (%): 50 Station: Ballotable Exam by:: [employer] RN [id number]  Labs: Lab Results  Component Value Date   WBC 12.3* 11/29/2012   HGB 14.4 11/29/2012   HCT 40.3 11/29/2012   MCV 84.8 11/29/2012   PLT 250 11/29/2012    Assessment / Plan: Induction of labor due to IUGR, gestational hypertension, gestational diabetes and neurofibromatosis,  progressing well on pitocin  Labor: No, IOL next cytotece ~1200 Preeclampsia:  no signs or symptoms of toxicity, moderate range BPs, will order labs at this time. Fetal Wellbeing:  Category II variable decels. Reassuring and reactive Pain Control:  Epidural and stadol. Pending epidural for active labor I/D:  Starting PCN for prophylaxis pending GBS Anticipated MOD:  NSVD  Beyounce Dickens, RYAN 11/30/2012, 10:46 AM

## 2012-11-30 NOTE — Progress Notes (Signed)
Kristy Campbell is a 27 y.o. G1P0000 at [redacted]w[redacted]d admitted for induction of labor due to Gestational diabetes, Hypertension, Poor fetal growth and neurofibromatosis.  Subjective: trialed 10mg  IV and IM nubain with 25mg  IM phenergan with mild pain control. Pt is alert and appears comfortable but reporting persistent pain  Objective: BP 120/69  Pulse 89  Temp(Src) 98 F (36.7 C) (Oral)  Resp 20  Ht 4\' 11"  (1.499 m)  Wt 137 lb (62.143 kg)  BMI 27.66 kg/m2  LMP 03/18/2012     Temp:  [98 F (36.7 C)-98.4 F (36.9 C)] 98 F (36.7 C) (07/25 0744) Pulse Rate:  [73-105] 89 (07/25 1258) Resp:  [16-20] 20 (07/25 1258) BP: (109-154)/(63-98) 120/69 mmHg (07/25 1258)   FHT:  FHR: 130s bpm, variability: moderate,  accelerations:  Present,  decelerations:  Absent UC:   Uterine irritability SVE:   Dilation: Fingertip Effacement (%): 50 Station: Ballotable Exam by:: Dr Ike Bene [id number]  Labs: Lab Results  Component Value Date   WBC 11.7* 11/30/2012   HGB 14.2 11/30/2012   HCT 39.8 11/30/2012   MCV 85.4 11/30/2012   PLT 209 11/30/2012   CMP     Component Value Date/Time   NA 134* 11/30/2012 1105   K 3.7 11/30/2012 1105   CL 101 11/30/2012 1105   CO2 22 11/30/2012 1105   GLUCOSE 84 11/30/2012 1105   BUN 4* 11/30/2012 1105   CREATININE 0.54 11/30/2012 1105   CALCIUM 9.3 11/30/2012 1105   PROT 6.5 11/30/2012 1105   ALBUMIN 2.8* 11/30/2012 1105   AST 18 11/30/2012 1105   ALT 14 11/30/2012 1105   ALKPHOS 159* 11/30/2012 1105   BILITOT 0.3 11/30/2012 1105   GFRNONAA >90 11/30/2012 1105   GFRAA >90 11/30/2012 1105      Assessment / Plan: Induction of labor due to IUGR, gestational hypertension, gestational diabetes and neurofibromatosis,  progressing well on pitocin  Labor: no change with cytotec, placed cook foley bulb at 1445 with 60ccV and U Preeclampsia:  no signs or symptoms of toxicity, BPs improved labs normal except pending Pr:Cr ration Fetal Wellbeing:  Category II variable decels. Reassuring  and reactive Pain Control:  rec'd pain meds as above. will wait for epidural at this time. I/D:  Starting PCN for prophylaxis pending GBS Anticipated MOD:  NSVD  Kristy Campbell, RYAN 11/30/2012, 2:51 PM

## 2012-11-30 NOTE — Progress Notes (Addendum)
   Kristy Campbell is a 27 y.o. G1P0000 at [redacted]w[redacted]d  admitted for induction of labor due to olighydramnios.  Subjective: sleeping Objective: BP 109/63  Pulse 91  Temp(Src) 98.3 F (36.8 C) (Oral)  Resp 16  Ht 4\' 11"  (1.499 m)  Wt 62.143 kg (137 lb)  BMI 27.66 kg/m2  LMP 03/18/2012    FHT:  FHR: 140 bpm, variability: moderate,  accelerations:  Present,  decelerations:  Absent UC:   irregular, every 2-4 minutes SVE:  LTC/posterior  Pt had previoulsy been contractint too often to place cytotec. Ctx have since slowed down, so 3rd one placed  Labs: Lab Results  Component Value Date   WBC 12.3* 11/29/2012   HGB 14.4 11/29/2012   HCT 40.3 11/29/2012   MCV 84.8 11/29/2012   PLT 250 11/29/2012    Assessment / Plan: IOL for polyhydramnios, ripening phase  Labor: no Fetal Wellbeing:  Category I Pain Control:  Labor support without medications Anticipated MOD:  NSVD  CRESENZO-DISHMAN,Duante Arocho 11/30/2012, 3:38 AM

## 2012-11-30 NOTE — Progress Notes (Signed)
Patient ID: Kristy Campbell, female   DOB: 06/13/1985, 27 y.o.   MRN: [id number]  S:  Pt states she is "miserable" with back pain. Has had stadol, nubain. Nothing is working very well. FB placed at 2:30-ish after several doses of cytotec and still in.  O: Filed Vitals:   11/30/12 1043 11/30/12 1258 11/30/12 1459 11/30/12 1856  BP: 120/93 120/69 130/60 135/91  Pulse: 81 89 104 82  Temp:   98.5 F (36.9 C)   TempSrc:   Oral   Resp: 20 20 20 20   Height:      Weight:        Cervix:  Uncooperative with exam due to discomfort. Cervix is very posterior and very high, cannot even feel external bulb. FHTs:  130, mod var, accels present, no decels. TOCO: q 2-8 minutes  A/P 27 y.o. G1P0000 at [redacted]w[redacted]d here for IOL for oligohydramnios, CHTN, A1 GDM. - Very slow cervical ripening - FB in place. Will deflate outer balloon and check next time and try some traction to catheter. Consider starting low dose pitocin next check. - Uncontrolled pain - too early for epidural - BP is controlled - blood sugar 70-80s - FHTs reactive/reassuring - Anticipate SVD but still not in labor  Napoleon Form, MD

## 2012-12-01 ENCOUNTER — Encounter (HOSPITAL_COMMUNITY)
Admission: AD | Disposition: A | Discharge status: Home / Self Care | Payer: Self-pay | Source: Ambulatory Visit | Attending: Obstetrics and Gynecology

## 2012-12-01 ENCOUNTER — Inpatient Hospital Stay (HOSPITAL_COMMUNITY): Payer: Medicaid Other

## 2012-12-01 ENCOUNTER — Encounter (HOSPITAL_COMMUNITY): Payer: Self-pay | Admitting: Anesthesiology

## 2012-12-01 DIAGNOSIS — O4100X0 Oligohydramnios, unspecified trimester, not applicable or unspecified: Secondary | ICD-10-CM

## 2012-12-01 DIAGNOSIS — O1002 Pre-existing essential hypertension complicating childbirth: Secondary | ICD-10-CM

## 2012-12-01 DIAGNOSIS — O365990 Maternal care for other known or suspected poor fetal growth, unspecified trimester, not applicable or unspecified: Secondary | ICD-10-CM

## 2012-12-01 DIAGNOSIS — Z98891 History of uterine scar from previous surgery: Secondary | ICD-10-CM

## 2012-12-01 LAB — GLUCOSE, CAPILLARY: Glucose-Capillary: 99 mg/dL (ref 70–99)

## 2012-12-01 LAB — CBC
HCT: 33.6 % — ABNORMAL LOW (ref 36.0–46.0)
Hemoglobin: 11.7 g/dL — ABNORMAL LOW (ref 12.0–15.0)
MCHC: 34.8 g/dL (ref 30.0–36.0)
MCV: 86.8 fL (ref 78.0–100.0)
RDW: 12.9 % (ref 11.5–15.5)

## 2012-12-01 LAB — TYPE AND SCREEN: ABO/RH(D): AB POS

## 2012-12-01 SURGERY — Surgical Case
Anesthesia: Epidural | Site: Abdomen | Wound class: Clean Contaminated

## 2012-12-01 MED ORDER — ACETAMINOPHEN 10 MG/ML IV SOLN
1000.0000 mg | Freq: Four times a day (QID) | INTRAVENOUS | Status: AC | PRN
  Filled 2012-12-01: qty 100

## 2012-12-01 MED ORDER — MEPERIDINE HCL 25 MG/ML IJ SOLN: 6.2500 mg | INTRAMUSCULAR | Status: DC | PRN

## 2012-12-01 MED ORDER — NALBUPHINE SYRINGE 5 MG/0.5 ML
5.0000 mg | INJECTION | INTRAMUSCULAR | Status: DC | PRN
  Filled 2012-12-01: qty 1

## 2012-12-01 MED ORDER — OXYTOCIN 40 UNITS IN LACTATED RINGERS INFUSION - SIMPLE MED: 62.5000 mL/h | INTRAVENOUS | Status: AC

## 2012-12-01 MED ORDER — FENTANYL CITRATE 0.05 MG/ML IJ SOLN
INTRAMUSCULAR | Status: AC
  Filled 2012-12-01: qty 2

## 2012-12-01 MED ORDER — TERBUTALINE SULFATE 1 MG/ML IJ SOLN
INTRAMUSCULAR | Status: AC
  Administered 2012-12-01: 0.25 mg
  Filled 2012-12-01: qty 1

## 2012-12-01 MED ORDER — NALBUPHINE HCL 10 MG/ML IJ SOLN
5.0000 mg | INTRAMUSCULAR | Status: DC | PRN
  Administered 2012-12-01: 10 mg via INTRAVENOUS
  Filled 2012-12-01 (×2): qty 1

## 2012-12-01 MED ORDER — ONDANSETRON HCL 4 MG/2ML IJ SOLN: 4.0000 mg | INTRAMUSCULAR | Status: DC | PRN

## 2012-12-01 MED ORDER — DIPHENHYDRAMINE HCL 25 MG PO CAPS: 25.0000 mg | ORAL_CAPSULE | Freq: Four times a day (QID) | ORAL | Status: DC | PRN

## 2012-12-01 MED ORDER — 0.9 % SODIUM CHLORIDE (POUR BTL) OPTIME
TOPICAL | Status: DC | PRN
  Administered 2012-12-01: 1000 mL

## 2012-12-01 MED ORDER — NALBUPHINE SYRINGE 5 MG/0.5 ML
INJECTION | INTRAMUSCULAR | Status: AC
  Administered 2012-12-01: 10 mg via INTRAVENOUS
  Filled 2012-12-01: qty 1

## 2012-12-01 MED ORDER — MENTHOL 3 MG MT LOZG: 1.0000 | LOZENGE | OROMUCOSAL | Status: DC | PRN

## 2012-12-01 MED ORDER — METOCLOPRAMIDE HCL 5 MG/ML IJ SOLN: 10.0000 mg | Freq: Three times a day (TID) | INTRAMUSCULAR | Status: DC | PRN

## 2012-12-01 MED ORDER — IBUPROFEN 600 MG PO TABS
600.0000 mg | ORAL_TABLET | Freq: Four times a day (QID) | ORAL | Status: DC
  Administered 2012-12-02 – 2012-12-03 (×7): 600 mg via ORAL
  Filled 2012-12-01 (×7): qty 1

## 2012-12-01 MED ORDER — SODIUM CHLORIDE 0.9 % IJ SOLN: 3.0000 mL | INTRAMUSCULAR | Status: DC | PRN

## 2012-12-01 MED ORDER — ONDANSETRON HCL 4 MG/2ML IJ SOLN
INTRAMUSCULAR | Status: DC | PRN
  Administered 2012-12-01: 4 mg via INTRAVENOUS

## 2012-12-01 MED ORDER — PHENYLEPHRINE HCL 10 MG/ML IJ SOLN
INTRAMUSCULAR | Status: DC | PRN
  Administered 2012-12-01 (×2): 40 ug via INTRAVENOUS

## 2012-12-01 MED ORDER — DIPHENHYDRAMINE HCL 50 MG/ML IJ SOLN: 25.0000 mg | INTRAMUSCULAR | Status: DC | PRN

## 2012-12-01 MED ORDER — TETANUS-DIPHTH-ACELL PERTUSSIS 5-2.5-18.5 LF-MCG/0.5 IM SUSP: 0.5000 mL | Freq: Once | INTRAMUSCULAR | Status: DC

## 2012-12-01 MED ORDER — PROMETHAZINE HCL 25 MG/ML IJ SOLN: 6.2500 mg | INTRAMUSCULAR | Status: DC | PRN

## 2012-12-01 MED ORDER — SCOPOLAMINE 1 MG/3DAYS TD PT72
MEDICATED_PATCH | TRANSDERMAL | Status: AC
  Administered 2012-12-01: 1.5 mg via TRANSDERMAL
  Filled 2012-12-01: qty 1

## 2012-12-01 MED ORDER — ESCITALOPRAM OXALATE 10 MG PO TABS
10.0000 mg | ORAL_TABLET | Freq: Every day | ORAL | Status: DC
  Administered 2012-12-01 – 2012-12-02 (×2): 10 mg via ORAL
  Filled 2012-12-01 (×3): qty 1

## 2012-12-01 MED ORDER — DIPHENHYDRAMINE HCL 50 MG/ML IJ SOLN: 12.5000 mg | INTRAMUSCULAR | Status: DC | PRN

## 2012-12-01 MED ORDER — KETOROLAC TROMETHAMINE 30 MG/ML IJ SOLN
30.0000 mg | Freq: Four times a day (QID) | INTRAMUSCULAR | Status: AC | PRN
  Filled 2012-12-01: qty 1

## 2012-12-01 MED ORDER — FENTANYL CITRATE 0.05 MG/ML IJ SOLN
INTRAMUSCULAR | Status: DC | PRN
  Administered 2012-12-01: 50 ug via INTRAVENOUS

## 2012-12-01 MED ORDER — KETOROLAC TROMETHAMINE 30 MG/ML IJ SOLN: 30.0000 mg | Freq: Four times a day (QID) | INTRAMUSCULAR | Status: AC | PRN

## 2012-12-01 MED ORDER — LANOLIN HYDROUS EX OINT: 1.0000 "application " | TOPICAL_OINTMENT | CUTANEOUS | Status: DC | PRN

## 2012-12-01 MED ORDER — MIDAZOLAM HCL 2 MG/2ML IJ SOLN: 0.5000 mg | Freq: Once | INTRAMUSCULAR | Status: DC | PRN

## 2012-12-01 MED ORDER — ONDANSETRON HCL 4 MG/2ML IJ SOLN: 4.0000 mg | Freq: Three times a day (TID) | INTRAMUSCULAR | Status: DC | PRN

## 2012-12-01 MED ORDER — MORPHINE SULFATE (PF) 0.5 MG/ML IJ SOLN
INTRAMUSCULAR | Status: DC | PRN
  Administered 2012-12-01: 3 mg via EPIDURAL

## 2012-12-01 MED ORDER — NALOXONE HCL 0.4 MG/ML IJ SOLN: 0.4000 mg | INTRAMUSCULAR | Status: DC | PRN

## 2012-12-01 MED ORDER — WITCH HAZEL-GLYCERIN EX PADS: 1.0000 "application " | MEDICATED_PAD | CUTANEOUS | Status: DC | PRN

## 2012-12-01 MED ORDER — SENNOSIDES-DOCUSATE SODIUM 8.6-50 MG PO TABS
2.0000 | ORAL_TABLET | Freq: Every day | ORAL | Status: DC
  Administered 2012-12-01 – 2012-12-02 (×2): 2 via ORAL

## 2012-12-01 MED ORDER — SIMETHICONE 80 MG PO CHEW
80.0000 mg | CHEWABLE_TABLET | Freq: Three times a day (TID) | ORAL | Status: DC
  Administered 2012-12-01 – 2012-12-03 (×5): 80 mg via ORAL

## 2012-12-01 MED ORDER — MAGNESIUM SULFATE BOLUS VIA INFUSION
4.0000 g | Freq: Once | INTRAVENOUS | Status: DC
  Filled 2012-12-01: qty 500

## 2012-12-01 MED ORDER — MAGNESIUM SULFATE 40 G IN LACTATED RINGERS - SIMPLE
2.0000 g/h | INTRAVENOUS | Status: DC
  Filled 2012-12-01: qty 500

## 2012-12-01 MED ORDER — DIPHENHYDRAMINE HCL 25 MG PO CAPS: 25.0000 mg | ORAL_CAPSULE | ORAL | Status: DC | PRN

## 2012-12-01 MED ORDER — FENTANYL CITRATE 0.05 MG/ML IJ SOLN
25.0000 ug | INTRAMUSCULAR | Status: DC | PRN
  Administered 2012-12-01 (×2): 50 ug via INTRAVENOUS

## 2012-12-01 MED ORDER — LACTATED RINGERS IV SOLN
INTRAVENOUS | Status: DC | PRN
  Administered 2012-12-01: 05:00:00 via INTRAVENOUS

## 2012-12-01 MED ORDER — ZOLPIDEM TARTRATE 5 MG PO TABS: 5.0000 mg | ORAL_TABLET | Freq: Every evening | ORAL | Status: DC | PRN

## 2012-12-01 MED ORDER — ONDANSETRON HCL 4 MG PO TABS: 4.0000 mg | ORAL_TABLET | ORAL | Status: DC | PRN

## 2012-12-01 MED ORDER — SODIUM BICARBONATE 8.4 % IV SOLN
INTRAVENOUS | Status: DC | PRN
  Administered 2012-12-01: 5 mL via EPIDURAL

## 2012-12-01 MED ORDER — OXYCODONE-ACETAMINOPHEN 5-325 MG PO TABS
1.0000 | ORAL_TABLET | ORAL | Status: DC | PRN
  Administered 2012-12-02 (×2): 1 via ORAL
  Administered 2012-12-02: 2 via ORAL
  Administered 2012-12-02: 1 via ORAL
  Administered 2012-12-03: 2 via ORAL
  Filled 2012-12-01: qty 2
  Filled 2012-12-01: qty 1
  Filled 2012-12-01: qty 2
  Filled 2012-12-01: qty 1
  Filled 2012-12-01: qty 2
  Filled 2012-12-01: qty 1

## 2012-12-01 MED ORDER — SCOPOLAMINE 1 MG/3DAYS TD PT72: 1.0000 | MEDICATED_PATCH | Freq: Once | TRANSDERMAL | Status: DC

## 2012-12-01 MED ORDER — NALOXONE HCL 1 MG/ML IJ SOLN
1.0000 ug/kg/h | INTRAVENOUS | Status: DC | PRN
  Filled 2012-12-01: qty 2

## 2012-12-01 MED ORDER — SIMETHICONE 80 MG PO CHEW: 80.0000 mg | CHEWABLE_TABLET | ORAL | Status: DC | PRN

## 2012-12-01 MED ORDER — LACTATED RINGERS IV SOLN
INTRAVENOUS | Status: DC
  Administered 2012-12-01: 10:00:00 via INTRAVENOUS

## 2012-12-01 MED ORDER — PRENATAL MULTIVITAMIN CH
1.0000 | ORAL_TABLET | Freq: Every day | ORAL | Status: DC
  Administered 2012-12-02 – 2012-12-03 (×2): 1 via ORAL
  Filled 2012-12-01 (×2): qty 1

## 2012-12-01 MED ORDER — KETOROLAC TROMETHAMINE 30 MG/ML IJ SOLN
INTRAMUSCULAR | Status: AC
  Administered 2012-12-01: 30 mg via INTRAMUSCULAR
  Filled 2012-12-01: qty 1

## 2012-12-01 MED ORDER — DIBUCAINE 1 % RE OINT: 1.0000 "application " | TOPICAL_OINTMENT | RECTAL | Status: DC | PRN

## 2012-12-01 MED ORDER — OXYTOCIN 10 UNIT/ML IJ SOLN
40.0000 [IU] | INTRAVENOUS | Status: DC | PRN
  Administered 2012-12-01: 40 [IU] via INTRAVENOUS

## 2012-12-01 SURGICAL SUPPLY — 33 items
BENZOIN TINCTURE PRP APPL 2/3 (GAUZE/BANDAGES/DRESSINGS) ×2 IMPLANT
CLAMP CORD UMBIL (MISCELLANEOUS) ×2 IMPLANT
CLOTH BEACON ORANGE TIMEOUT ST (SAFETY) ×2 IMPLANT
DRAPE LG THREE QUARTER DISP (DRAPES) ×2 IMPLANT
DRSG OPSITE POSTOP 4X10 (GAUZE/BANDAGES/DRESSINGS) ×2 IMPLANT
DURAPREP 26ML APPLICATOR (WOUND CARE) ×2 IMPLANT
ELECT REM PT RETURN 9FT ADLT (ELECTROSURGICAL) ×2
ELECTRODE REM PT RTRN 9FT ADLT (ELECTROSURGICAL) ×1 IMPLANT
EXTRACTOR VACUUM KIWI (MISCELLANEOUS) IMPLANT
GLOVE BIO SURGEON ST LM GN SZ9 (GLOVE) ×2 IMPLANT
GLOVE BIOGEL PI IND STRL 9 (GLOVE) ×1 IMPLANT
GLOVE BIOGEL PI INDICATOR 9 (GLOVE) ×1
GOWN PREVENTION PLUS XLARGE (GOWN DISPOSABLE) ×2 IMPLANT
GOWN STRL REIN 3XL LVL4 (GOWN DISPOSABLE) ×2 IMPLANT
GOWN STRL REIN XL XLG (GOWN DISPOSABLE) ×4 IMPLANT
NEEDLE HYPO 25X5/8 SAFETYGLIDE (NEEDLE) IMPLANT
NS IRRIG 1000ML POUR BTL (IV SOLUTION) ×2 IMPLANT
PACK C SECTION WH (CUSTOM PROCEDURE TRAY) ×2 IMPLANT
PAD OB MATERNITY 4.3X12.25 (PERSONAL CARE ITEMS) ×2 IMPLANT
RETRACTOR WND ALEXIS 25 LRG (MISCELLANEOUS) ×1 IMPLANT
RTRCTR C-SECT PINK 25CM LRG (MISCELLANEOUS) IMPLANT
RTRCTR WOUND ALEXIS 25CM LRG (MISCELLANEOUS) ×2
STRIP CLOSURE SKIN 1/2X4 (GAUZE/BANDAGES/DRESSINGS) ×2 IMPLANT
SUT CHROMIC 0 CTX 36 (SUTURE) ×4 IMPLANT
SUT VIC AB 0 CT1 27 (SUTURE) ×1
SUT VIC AB 0 CT1 27XBRD ANBCTR (SUTURE) ×1 IMPLANT
SUT VIC AB 2-0 CT1 27 (SUTURE) ×2
SUT VIC AB 2-0 CT1 TAPERPNT 27 (SUTURE) ×2 IMPLANT
SUT VIC AB 4-0 KS 27 (SUTURE) ×2 IMPLANT
SYR BULB IRRIGATION 50ML (SYRINGE) IMPLANT
TOWEL OR 17X24 6PK STRL BLUE (TOWEL DISPOSABLE) ×2 IMPLANT
TRAY FOLEY CATH 14FR (SET/KITS/TRAYS/PACK) IMPLANT
WATER STERILE IRR 1000ML POUR (IV SOLUTION) IMPLANT

## 2012-12-01 NOTE — Anesthesia Postprocedure Evaluation (Signed)
  Anesthesia Post-op Note  Patient: Kristy Campbell  Procedure(s) Performed: Procedure(s): Primary Cesarean Section Delivery Baby Boy  @ [phone],    Apgars 9/9 (N/A)  Patient Location: Mother/Baby  Anesthesia Type:Epidural  Level of Consciousness: awake, alert  and oriented  Airway and Oxygen Therapy: Patient Spontanous Breathing  Post-op Pain: mild  Post-op Assessment: Post-op Vital signs reviewed, Patient's Cardiovascular Status Stable, Respiratory Function Stable, Patent Airway, No signs of Nausea or vomiting, Pain level controlled, No headache, No backache, No residual numbness and No residual motor weakness  Post-op Vital Signs: stable  Complications: No apparent anesthesia complications

## 2012-12-01 NOTE — Op Note (Signed)
  PATIENT:  Kristy Campbell  27 y.o. female  PRE-OPERATIVE DIAGNOSIS:  Cord Presentation (funic)_pregnancy 37 weeks, IUGR, maternal neurofibromatiosis, , chronic hypertension, gestational diabetes.  POST-OPERATIVE DIAGNOSIS: Cord Presentation (funic)_pregnancy 37 weeks, IUGR, maternal neurofibromatiosis, , chronic hypertension, gestational diabetes.  PROCEDURE:  Procedure(s): Primary Cesarean Section Delivery Baby Boy  @ [phone],    Apgars 9/9 (N/A) Low transverse incision SURGEON:  Surgeon(s) and Role:    * Tilda Burrow, MD - Primary  Details of procedure: Patient was taken to the operating room epidural analgesia confirmed as effective, timeout conducted and confirmed by surgical team and Ancef 2 g intravenously administered. Transverse lower abdominal incision was made in the method of Pfannenstiel without difficulty and bladder flap developed on the lower uterine segment which was well developed. Transverse uterine incision was made after twice Alexis wound retractor into the incision, and the fetal vertex delivered through the incision with fundal pressure and manual guidance. Was clamped, and baby passed to the waiting pediatricians in good condition see their operative notes for details. Placenta was delivered by Cred uterine massage. Amniotic fluid had been clear. There was no malodor. \Uterus was irrigated with saline solution, then closed in a running locking first layer with 0 chromic in a running continuous locking layer with 0 chromic. Was loosely reapproximated with interrupted 2-0 Vicryl x2 sutures. Abdomen was irrigated briefly, anterior peritoneum closed using running 2-0 Vicryl, the fascia closed with 2-0 Vicryl and then oversewn with interrupted 0 Vicryl times 3 interrupted sutures, and then the subcutaneous tissues reapproximated with interrupted 2-0 Vicryl, then subcuticular 4-0 Vicryl used  close the skin incision sponge and needle counts were correct throughout. estimated  blood loss 500 cc or less. Condition to recovery room good

## 2012-12-01 NOTE — Progress Notes (Signed)
U/S at bedside.  Called M. Williams CNM, Mag gtt held and terbutaline .25mg  given sq. Pt HR 100-105.

## 2012-12-01 NOTE — Progress Notes (Signed)
Kristy Campbell is a 27 y.o. G1P0000 at [redacted]w[redacted]d by ultrasound admitted for induction of labor due to Low amniotic fluid.., Induction progressing slowly, 2-bulb foley  In cervix adjusted at 10:30 pm, and lo-dose pitocin begun. We have been able to progress to 8 mU/min, now backed down to 6  Mu /min due to intermittent decels, with excellent B-t-B throughout.   Subjective: Epidural effective, no pt discomfort. Foley to be placed.   Objective: BP 110/60  Pulse 96  Temp(Src) 98.8 F (37.1 C) (Oral)  Resp 18  Ht 4\' 11"  (1.499 m)  Wt 62.143 kg (137 lb)  BMI 27.66 kg/m2  SpO2 97%  LMP 03/18/2012      FHT:  FHR: 145 bpm, variability: moderate,  accelerations:  Present,  decelerations:  Present infrequent with prompt recovery. resolved p reduced pitocin rates. 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

## 2012-12-01 NOTE — Brief Op Note (Signed)
11/29/2012 - 12/01/2012  5:57 AM  PATIENT:  Kristy Campbell  27 y.o. female  PRE-OPERATIVE DIAGNOSIS:  Cord Presentation (funic)_pregnancy 37 weeks, IUGR, maternal neurofibromatiosis, , chronic hypertension, gestational diabetes.  POST-OPERATIVE DIAGNOSIS: Cord Presentation (funic)_pregnancy 37 weeks, IUGR, maternal neurofibromatiosis, , chronic hypertension, gestational diabetes.  PROCEDURE:  Procedure(s): Primary Cesarean Section Delivery Baby Boy  @ [phone],    Apgars 9/9 (N/A) Low transverse incision SURGEON:  Surgeon(s) and Role:    * Tilda Burrow, MD - Primary  PHYSICIAN ASSISTANT:   ASSISTANTS: none   ANESTHESIA:   epidural  EBL:  Total I/O In: -  Out: 600 [Urine:100; Blood:500]  BLOOD ADMINISTERED:none  DRAINS: Urinary Catheter (Foley)   LOCAL MEDICATIONS USED:  NONE  SPECIMEN:  Source of Specimen:  placenta to L&D  DISPOSITION OF SPECIMEN:  L&D  COUNTS:  YES  TOURNIQUET:  * No tourniquets in log *  DICTATION: .Dragon Dictation  PLAN OF CARE: Admit to inpatient   PATIENT DISPOSITION:  PACU - hemodynamically stable.   Delay start of Pharmacological VTE agent (>24hrs) due to surgical blood loss or risk of bleeding: not applicable

## 2012-12-01 NOTE — Anesthesia Postprocedure Evaluation (Signed)
  Anesthesia Post-op Note  Patient: Kristy Campbell  Procedure(s) Performed: Procedure(s): Primary Cesarean Section Delivery Baby Boy  @ [phone],    Apgars 9/9 (N/A)  Patient is awake, responsive, moving her legs, and has signs of resolution of her numbness. Pain and nausea are reasonably well controlled. Vital signs are stable and clinically acceptable. Oxygen saturation is clinically acceptable. There are no apparent anesthetic complications at this time. Patient is ready for discharge.

## 2012-12-01 NOTE — Transfer of Care (Signed)
Immediate Anesthesia Transfer of Care Note  Patient: Kristy Campbell  Procedure(s) Performed: Procedure(s): Primary Cesarean Section Delivery Baby Boy  @ [phone],    Apgars 9/9 (N/A)  Patient Location: PACU  Anesthesia Type:Epidural  Level of Consciousness: awake, alert , oriented and patient cooperative  Airway & Oxygen Therapy: Patient Spontanous Breathing  Post-op Assessment: Report given to PACU RN and Post -op Vital signs reviewed and stable  Post vital signs: Reviewed and stable  Complications: No apparent anesthesia complications

## 2012-12-01 NOTE — Progress Notes (Signed)
CSW met with MOB.  No barriers to discharge at this time.  Full consult report to follow.   [phone] 

## 2012-12-01 NOTE — Anesthesia Postprocedure Evaluation (Signed)
  Anesthesia Post Note  Patient: Kristy Campbell  Procedure(s) Performed: Procedure(s) (LRB): Primary Cesarean Section Delivery Baby Boy  @ [phone],    Apgars 9/9 (N/A)  Anesthesia type: Epidural  Patient location: PACU  Post pain: Pain level controlled  Post assessment: Post-op Vital signs reviewed  Last Vitals:  Filed Vitals:   12/01/12 0645  BP: 95/55  Pulse: 114  Temp:   Resp: 18    Post vital signs: Reviewed  Level of consciousness: awake  Complications: No apparent anesthesia complications

## 2012-12-01 NOTE — Progress Notes (Signed)
Patient ID: Kristy Campbell, female   DOB: Aug 30, 1985, 27 y.o.   MRN: [id number]  RN reported she had developed more frequent variable decels with UCs.  So I checked her cervix, balloon was in vagina so it was removed  Cervix found to be 4-5/100/-2 with bulging membranes  I felt the contour of the bulging membranes was abnormal and irregular, suspected there might be a cord floating inside membrane, in front of head.  Dr Emelda Fear consulted and bedside informal US done  This revealed what appeared to be a funic presentation  Formal bedside US ordered and decision made to stop contractions since pt was feeling pressure  Patient's HR initially 125, so decision made not to use Terb. Magnesium ordered  HR then lowered to 105, so Terb given instead, UCs slowed.  >>>>>>>>>>>>>>>    Bedside formal US confirms funic presentation, will prep for C/S

## 2012-12-02 LAB — CBC
Hemoglobin: 10.8 g/dL — ABNORMAL LOW (ref 12.0–15.0)
RBC: 3.63 MIL/uL — ABNORMAL LOW (ref 3.87–5.11)

## 2012-12-02 LAB — CULTURE, BETA STREP (GROUP B ONLY)

## 2012-12-02 LAB — GLUCOSE, CAPILLARY: Glucose-Capillary: 86 mg/dL (ref 70–99)

## 2012-12-02 NOTE — Progress Notes (Signed)
Subjective: Postpartum Day 1: Cesarean Delivery Eating, drinking, voiding, ambulating well.  +flatus.  Lochia wnl. Reports incisional pain.  Denies dizziness, lightheadedness, or sob. No complaints.    Objective: Vital signs in last 24 hours: Temp:  [98.4 F (36.9 C)-99.8 F (37.7 C)] 98.8 F (37.1 C) (07/27 0542) Pulse Rate:  [73-126] 73 (07/27 0542) Resp:  [16-18] 18 (07/27 0542) BP: (104-124)/(48-76) 104/64 mmHg (07/27 0542) SpO2:  [95 %-98 %] 96 % (07/27 0137)  Physical Exam:  General: alert, cooperative and no distress Lochia: appropriate Uterine Fundus: firm Incision: healing well, no significant drainage, no dehiscence, no significant erythema DVT Evaluation: No evidence of DVT seen on physical exam. Negative Homan's sign. No cords or calf tenderness. No significant calf/ankle edema.   Recent Labs  12/01/12 0655 12/02/12 0651  HGB 11.7* 10.8*  HCT 33.6* 31.8*    Assessment/Plan: Status post Cesarean section. Doing well postoperatively.  Continue current care. Encouraged ambulation and splinting incision.  Bottlefeeding, undecided about contraception. OP circumcision.  [name] 12/02/2012, 7:34 AM
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
## 2012-12-02 NOTE — Progress Notes (Signed)
CORRECTION ON LAST NOTE.  MOB is continuing medication management with Lexapro.  Please disregard comment on medication in last note.   [phone]

## 2012-12-03 ENCOUNTER — Encounter: Payer: Medicaid Other | Admitting: Obstetrics & Gynecology

## 2012-12-03 ENCOUNTER — Encounter (HOSPITAL_COMMUNITY): Payer: Self-pay | Admitting: Obstetrics and Gynecology

## 2012-12-03 MED ORDER — IBUPROFEN 600 MG PO TABS: 600.0000 mg | ORAL_TABLET | Freq: Four times a day (QID) | ORAL | Status: DC

## 2012-12-03 MED ORDER — SENNOSIDES-DOCUSATE SODIUM 8.6-50 MG PO TABS: 2.0000 | ORAL_TABLET | Freq: Every day | ORAL | Status: DC

## 2012-12-03 MED ORDER — OXYCODONE-ACETAMINOPHEN 5-325 MG PO TABS: 1.0000 | ORAL_TABLET | ORAL | Status: DC | PRN

## 2012-12-03 NOTE — Discharge Summary (Signed)
  Physician Obstetric Discharge Summary  Patient ID: Ninoska Goswick MRN: [id number] DOB/AGE: 07/03/1985 27 y.o.  Reason for Admission: induction of labor for oligohydramnios and suspected fetal growth restriction Prenatal Procedures: none Intrapartum Procedures: cesarean: low cervical, transverse Postpartum Procedures: none Complications-Operative and Postpartum: none  Delivery Note At 5:23 AM a viable female was delivered via C-Section, Low Transverse (Presentation: ; Occiput Anterior) for cord presentation.  APGAR: 9, 9; weight 5 lb 7.7 oz (2486 g).   Placenta status: Intact, Spontaneous.  Cord: 3 vessels with the following complications: None.       H/H:  Lab Results  Component Value Date/Time   HGB 10.8* 12/02/2012  6:51 AM   HCT 31.8* 12/02/2012  6:51 AM    Brief Hospital Course: Declyn Offield is a G1P0100 who underwent cesarean section on 11/29/2012 for cord presentation after initial IOL as above.  Patient had an uncomplicated surgery; for further details of this surgery, please refer to the operative note.  Patient had an uncomplicated postpartum course.  She had a hx of cHTN and A1DM but both were well controlled off medication.   By time of discharge on POD#2/PPD#2, her pain was controlled on oral pain medications; she had appropriate lochia and was ambulating, voiding without difficulty, tolerating regular diet and passing flatus.   She was deemed stable for discharge to home.     Discharge Diagnoses: Term Pregnancy-delivered  Discharge Information: Date: 12/03/2012 Activity: pelvic rest Diet: routine Baby feeding: plans to bottle feed Contraception: no method Medications: PNV, Ibuprofen, Colace and Percocet Discharged Condition: good Instructions: refer to practice specific booklet Discharge to: home  Signed: Rulon Abide, M.D. [facility] 12/03/2012, 9:50 AM

## 2012-12-03 NOTE — Progress Notes (Signed)
UR chart review completed.  

## 2012-12-06 ENCOUNTER — Ambulatory Visit (HOSPITAL_COMMUNITY): Payer: Medicaid Other

## 2012-12-13 ENCOUNTER — Ambulatory Visit (HOSPITAL_COMMUNITY): Payer: Medicaid Other

## 2013-08-31 ENCOUNTER — Emergency Department (HOSPITAL_COMMUNITY)
Admission: EM | Admit: 2013-08-31 | Discharge: 2013-09-01 | Disposition: A | Discharge status: Home / Self Care | Payer: Medicaid Other | Attending: Emergency Medicine | Admitting: Emergency Medicine

## 2013-08-31 ENCOUNTER — Encounter (HOSPITAL_COMMUNITY): Payer: Self-pay | Admitting: Emergency Medicine

## 2013-08-31 DIAGNOSIS — O99345 Other mental disorders complicating the puerperium: Secondary | ICD-10-CM

## 2013-08-31 DIAGNOSIS — F411 Generalized anxiety disorder: Secondary | ICD-10-CM | POA: Insufficient documentation

## 2013-08-31 DIAGNOSIS — F32A Depression, unspecified: Secondary | ICD-10-CM

## 2013-08-31 DIAGNOSIS — E860 Dehydration: Secondary | ICD-10-CM | POA: Diagnosis present

## 2013-08-31 DIAGNOSIS — F419 Anxiety disorder, unspecified: Secondary | ICD-10-CM | POA: Diagnosis present

## 2013-08-31 DIAGNOSIS — F3289 Other specified depressive episodes: Secondary | ICD-10-CM | POA: Insufficient documentation

## 2013-08-31 DIAGNOSIS — F430 Acute stress reaction: Secondary | ICD-10-CM | POA: Insufficient documentation

## 2013-08-31 DIAGNOSIS — I10 Essential (primary) hypertension: Secondary | ICD-10-CM | POA: Insufficient documentation

## 2013-08-31 DIAGNOSIS — F53 Postpartum depression: Secondary | ICD-10-CM | POA: Diagnosis present

## 2013-08-31 DIAGNOSIS — G43909 Migraine, unspecified, not intractable, without status migrainosus: Secondary | ICD-10-CM | POA: Insufficient documentation

## 2013-08-31 DIAGNOSIS — Z79899 Other long term (current) drug therapy: Secondary | ICD-10-CM | POA: Insufficient documentation

## 2013-08-31 DIAGNOSIS — Z8632 Personal history of gestational diabetes: Secondary | ICD-10-CM | POA: Insufficient documentation

## 2013-08-31 DIAGNOSIS — G709 Myoneural disorder, unspecified: Secondary | ICD-10-CM | POA: Insufficient documentation

## 2013-08-31 DIAGNOSIS — F329 Major depressive disorder, single episode, unspecified: Secondary | ICD-10-CM

## 2013-08-31 LAB — COMPREHENSIVE METABOLIC PANEL
ALBUMIN: 4 g/dL (ref 3.5–5.2)
ALK PHOS: 56 U/L (ref 39–117)
ALT: 17 U/L (ref 0–35)
AST: 13 U/L (ref 0–37)
BUN: 13 mg/dL (ref 6–23)
CALCIUM: 10 mg/dL (ref 8.4–10.5)
CO2: 25 mEq/L (ref 19–32)
CREATININE: 0.71 mg/dL (ref 0.50–1.10)
Chloride: 97 mEq/L (ref 96–112)
GFR calc non Af Amer: >90 mL/min (ref >90)
GLUCOSE: 97 mg/dL (ref 70–99)
POTASSIUM: 4.2 meq/L (ref 3.7–5.3)
Sodium: 133 mEq/L — ABNORMAL LOW (ref 137–147)
TOTAL PROTEIN: 7 g/dL (ref 6.0–8.3)
Total Bilirubin: <0.2 mg/dL — ABNORMAL LOW (ref 0.3–1.2)

## 2013-08-31 LAB — RAPID URINE DRUG SCREEN, HOSP PERFORMED
AMPHETAMINES: NOT DETECTED
BENZODIAZEPINES: POSITIVE — AB
Barbiturates: POSITIVE — AB
COCAINE: NOT DETECTED
OPIATES: NOT DETECTED
TETRAHYDROCANNABINOL: NOT DETECTED

## 2013-08-31 LAB — CBC
HEMATOCRIT: 38.2 % (ref 36.0–46.0)
HEMOGLOBIN: 13 g/dL (ref 12.0–15.0)
MCH: 30 pg (ref 26.0–34.0)
MCHC: 34 g/dL (ref 30.0–36.0)
MCV: 88.2 fL (ref 78.0–100.0)
Platelets: 297 10*3/uL (ref 150–400)
RBC: 4.33 MIL/uL (ref 3.87–5.11)
RDW: 12.3 % (ref 11.5–15.5)
WBC: 6.9 10*3/uL (ref 4.0–10.5)

## 2013-08-31 LAB — ETHANOL: Alcohol, Ethyl (B): <11 mg/dL (ref 0–11)

## 2013-08-31 MED ORDER — ZINC OXIDE 40 % EX OINT: TOPICAL_OINTMENT | CUTANEOUS | Status: DC | PRN

## 2013-08-31 MED ORDER — SODIUM CHLORIDE 0.9 % IV SOLN: INTRAVENOUS | Status: DC

## 2013-08-31 MED ORDER — ACETAMINOPHEN 325 MG PO TABS: 650.0000 mg | ORAL_TABLET | ORAL | Status: DC | PRN

## 2013-08-31 MED ORDER — LORAZEPAM 1 MG PO TABS: 1.0000 mg | ORAL_TABLET | Freq: Three times a day (TID) | ORAL | Status: DC | PRN

## 2013-08-31 MED ORDER — IBUPROFEN 200 MG PO TABS: 600.0000 mg | ORAL_TABLET | Freq: Once | ORAL | Status: DC

## 2013-08-31 NOTE — ED Notes (Signed)
Pt belongings: blue pants, pink and gray bra, gray shirt, red sweatshirt, black shirt, pink panties, blue socks, pocketbook, Apple Iphone

## 2013-08-31 NOTE — Discharge Instructions (Signed)
Per our psychiatric team recommends, follow up with a psychiatrist at your counseling center this week, also discuss medication management with them during that visit - call Monday morning to arrange appointment. Also, see resource guide provided for additional community resources.   Utilize family support and consider additional child care assistance.  Return to ER right away if worse, worsening or severe depression, thoughts of harm to self or others, other concern.      Depression, Adult Depression refers to feeling sad, low, down in the dumps, blue, gloomy, or empty. In general, there are two kinds of depression: 1. Depression that we all experience from time to time because of upsetting life experiences, including the loss of a job or the ending of a relationship (normal sadness or normal grief). This kind of depression is considered normal, is short lived, and resolves within a few days to 2 weeks. (Depression experienced after the loss of a loved one is called bereavement. Bereavement often lasts longer than 2 weeks but normally gets better with time.) 2. Clinical depression, which lasts longer than normal sadness or normal grief or interferes with your ability to function at home, at work, and in school. It also interferes with your personal relationships. It affects almost every aspect of your life. Clinical depression is an illness. Symptoms of depression also can be caused by conditions other than normal sadness and grief or clinical depression. Examples of these conditions are listed as follows:  Physical illness Some physical illnesses, including underactive thyroid gland (hypothyroidism), severe anemia, specific types of cancer, diabetes, uncontrolled seizures, heart and lung problems, strokes, and chronic pain are commonly associated with symptoms of depression.  Side effects of some prescription medicine In some people, certain types of prescription medicine can cause symptoms of  depression.  Substance abuse Abuse of alcohol and illicit drugs can cause symptoms of depression. SYMPTOMS Symptoms of normal sadness and normal grief include the following:  Feeling sad or crying for short periods of time.  Not caring about anything (apathy).  Difficulty sleeping or sleeping too much.  No longer able to enjoy the things you used to enjoy.  Desire to be by oneself all the time (social isolation).  Lack of energy or motivation.  Difficulty concentrating or remembering.  Change in appetite or weight.  Restlessness or agitation. Symptoms of clinical depression include the same symptoms of normal sadness or normal grief and also the following symptoms:  Feeling sad or crying all the time.  Feelings of guilt or worthlessness.  Feelings of hopelessness or helplessness.  Thoughts of suicide or the desire to harm yourself (suicidal ideation).  Loss of touch with reality (psychotic symptoms). Seeing or hearing things that are not real (hallucinations) or having false beliefs about your life or the people around you (delusions and paranoia). DIAGNOSIS  The diagnosis of clinical depression usually is based on the severity and duration of the symptoms. Your caregiver also will ask you questions about your medical history and substance use to find out if physical illness, use of prescription medicine, or substance abuse is causing your depression. Your caregiver also may order blood tests. TREATMENT  Typically, normal sadness and normal grief do not require treatment. However, sometimes antidepressant medicine is prescribed for bereavement to ease the depressive symptoms until they resolve. The treatment for clinical depression depends on the severity of your symptoms but typically includes antidepressant medicine, counseling with a mental health professional, or a combination of both. Your caregiver will help to determine what  treatment is best for you. Depression caused  by physical illness usually goes away with appropriate medical treatment of the illness. If prescription medicine is causing depression, talk with your caregiver about stopping the medicine, decreasing the dose, or substituting another medicine. Depression caused by abuse of alcohol or illicit drugs abuse goes away with abstinence from these substances. Some adults need professional help in order to stop drinking or using drugs. SEEK IMMEDIATE CARE IF:  You have thoughts about hurting yourself or others.  You lose touch with reality (have psychotic symptoms).  You are taking medicine for depression and have a serious side effect. FOR MORE INFORMATION National Alliance on Mental Illness: www.nami.[employer] National Institute of Mental Health: http://www.maynard.net/www.nimh.nih.gov Document Released: 04/22/2000 Document Revised: 10/25/2011 Document Reviewed: 07/25/2011 [facility] Patient Information 2014 [place], [place].    Generalized Anxiety Disorder Generalized anxiety disorder (GAD) is a mental disorder. It interferes with life functions, including relationships, work, and school. GAD is different from normal anxiety, which everyone experiences at some point in their lives in response to specific life events and activities. Normal anxiety actually helps us prepare for and get through these life events and activities. Normal anxiety goes away after the event or activity is over.  GAD causes anxiety that is not necessarily related to specific events or activities. It also causes excess anxiety in proportion to specific events or activities. The anxiety associated with GAD is also difficult to control. GAD can vary from mild to severe. People with severe GAD can have intense waves of anxiety with physical symptoms (panic attacks).  SYMPTOMS The anxiety and worry associated with GAD are difficult to control. This anxiety and worry are related to many life events and activities and also occur more days than not for 6 months  or longer. People with GAD also have three or more of the following symptoms (one or more in children):  Restlessness.   Fatigue.  Difficulty concentrating.   Irritability.  Muscle tension.  Difficulty sleeping or unsatisfying sleep. DIAGNOSIS GAD is diagnosed through an assessment by your caregiver. Your caregiver will ask you questions aboutyour mood,physical symptoms, and events in your life. Your caregiver may ask you about your medical history and use of alcohol or drugs, including prescription medications. Your caregiver may also do a physical exam and blood tests. Certain medical conditions and the use of certain substances can cause symptoms similar to those associated with GAD. Your caregiver may refer you to a mental health specialist for further evaluation. TREATMENT The following therapies are usually used to treat GAD:   Medication Antidepressant medication usually is prescribed for long-term daily control. Antianxiety medications may be added in severe cases, especially when panic attacks occur.   Talk therapy (psychotherapy) Certain types of talk therapy can be helpful in treating GAD by providing support, education, and guidance. A form of talk therapy called cognitive behavioral therapy can teach you healthy ways to think about and react to daily life events and activities.  Stress managementtechniques These include yoga, meditation, and exercise and can be very helpful when they are practiced regularly. A mental health specialist can help determine which treatment is best for you. Some people see improvement with one therapy. However, other people require a combination of therapies. Document Released: 08/20/2012 Document Reviewed: 08/20/2012 [facility] Patient Information 2014 [employer], [place].

## 2013-08-31 NOTE — ED Provider Notes (Addendum)
CSN: [id number]     Arrival date & time 08/31/13  1853 History   First MD Initiated Contact with Patient 08/31/13 1915     Chief Complaint  Patient presents with  . Depression     (Consider location/radiation/quality/duration/timing/severity/associated sxs/prior Treatment) The history is provided by the patient.  pt w hx anxiety, states since becoming pregnant last year, and since delivery of her baby, she has felt stressed and depressed. Indicates has been on various antidepressants in past, including lexapro and current celexa, but that she doesn't feel these medications have helped. States also goes to outpatient counseling for anxiety and depression but feels minimal benefit from that.  States things such as baby's noises, and crying can set her off. Pt indicates she is a single mother, lives w hermother, but states her mom works full time, and gives limited help with childcare. Pt feels she could benefit from some extra childcare help, but there is no one available. States decreased appetite. Some trouble sleeping at night. Denies any thoughts of harm to self, harm to baby, or others.     Past Medical History  Diagnosis Date  . Mental disorder   . Anxiety   . Depression   . Neuromuscular disorder   . Hypertension     Dx 22  . Gestational diabetes    Past Surgical History  Procedure Laterality Date  . Cesarean section N/A 12/01/2012    Procedure: Primary Cesarean Section Delivery Baby Boy  @ [phone],    Apgars 9/9;  Surgeon: Tilda Burrow, MD;  Location: [facility];  Service: Obstetrics;  Laterality: N/A;   No family history on file. History  Substance Use Topics  . Smoking status: Never Smoker   . Smokeless tobacco: Not on file  . Alcohol Use: No   OB History   Grav Para Term Preterm Abortions TAB SAB Ect Mult Living   1 1 0 1 0 0 0 0 0 0      Review of Systems  Constitutional: Negative for fever and chills.  HENT: Negative for sore throat.   Eyes: Negative for pain and  redness.  Respiratory: Negative for shortness of breath.   Cardiovascular: Negative for chest pain.  Gastrointestinal: Negative for vomiting and abdominal pain.  Genitourinary: Negative for flank pain.  Musculoskeletal: Negative for back pain, neck pain and neck stiffness.  Skin: Negative for rash.  Neurological: Positive for headaches. Negative for weakness and numbness.       States hx migraines, is having intermittent frontal headaches, dull, several x/week, c/w prior headaches.   Hematological: Does not bruise/bleed easily.  Psychiatric/Behavioral: Positive for dysphoric mood. Negative for suicidal ideas.      Allergies  Haldol  Home Medications   Prior to Admission medications   Medication Sig Start Date End Date Taking? Authorizing Provider  ALPRAZolam Prudy Feeler) 1 MG tablet Take 1 mg by mouth 2 (two) times daily as needed for anxiety (anxiety).   Yes Historical Provider, MD  amphetamine-dextroamphetamine (ADDERALL XR) 15 MG 24 hr capsule Take 15 mg by mouth daily.   Yes Historical Provider, MD  ATENOLOL PO Take 1 tablet by mouth.   Yes Historical Provider, MD  butalbital-acetaminophen-caffeine (FIORICET, ESGIC) 50-325-40 MG per tablet Take 1 tablet by mouth 2 (two) times daily as needed for headache (headache).   Yes Historical Provider, MD  citalopram (CELEXA) 20 MG tablet Take 20 mg by mouth daily.   Yes Historical Provider, MD  ondansetron (ZOFRAN) 8 MG tablet Take by mouth every  8 (eight) hours as needed for nausea or vomiting (nausea).   Yes Historical Provider, MD  oxyCODONE-acetaminophen (PERCOCET/ROXICET) 5-325 MG per tablet Take 1-2 tablets by mouth every 4 (four) hours as needed for severe pain (pain).   Yes Historical Provider, MD   BP 124/78  Pulse 69  Temp(Src) 98.4 F (36.9 C) (Oral)  Resp 16  SpO2 100%  LMP 08/21/2013  Breastfeeding? No Physical Exam  Nursing note and vitals reviewed. Constitutional: She is oriented to person, place, and time. She appears  well-developed and well-nourished. No distress.  HENT:  Mouth/Throat: Oropharynx is clear and moist.  No sinus or temporal tenderness.   Eyes: Conjunctivae are normal. Pupils are equal, round, and reactive to light. No scleral icterus.  Neck: Neck supple. No tracheal deviation present.  No neck stiffness or rigidity  Cardiovascular: Normal rate, normal heart sounds and intact distal pulses.   Pulmonary/Chest: Effort normal and breath sounds normal. No respiratory distress.  Abdominal: Soft. Normal appearance. She exhibits no distension. There is no tenderness.  Musculoskeletal: She exhibits no edema and no tenderness.  Neurological: She is alert and oriented to person, place, and time. No cranial nerve deficit.  Motor intact bil. Normal coordination. Steady gait.   Skin: Skin is warm and dry. No rash noted.  Psychiatric:  Depressed mood, flat affect. Denies SI.      ED Course  Procedures (including critical care time)  Results for orders placed during the hospital encounter of 08/31/13  CBC      Result Value Ref Range   WBC 6.9  4.0 - 10.5 K/uL   RBC 4.33  3.87 - 5.11 MIL/uL   Hemoglobin 13.0  12.0 - 15.0 g/dL   HCT 40.938.2  81.136.0 - 91.446.0 %   MCV 88.2  78.0 - 100.0 fL   MCH 30.0  26.0 - 34.0 pg   MCHC 34.0  30.0 - 36.0 g/dL   RDW 78.212.3  95.611.5 - 21.315.5 %   Platelets 297  150 - 400 K/uL  COMPREHENSIVE METABOLIC PANEL      Result Value Ref Range   Sodium 133 (*) 137 - 147 mEq/L   Potassium 4.2  3.7 - 5.3 mEq/L   Chloride 97  96 - 112 mEq/L   CO2 25  19 - 32 mEq/L   Glucose, Bld 97  70 - 99 mg/dL   BUN 13  6 - 23 mg/dL   Creatinine, Ser 0.860.71  0.50 - 1.10 mg/dL   Calcium 57.810.0  8.4 - 46.910.5 mg/dL   Total Protein 7.0  6.0 - 8.3 g/dL   Albumin 4.0  3.5 - 5.2 g/dL   AST 13  0 - 37 U/L   ALT 17  0 - 35 U/L   Alkaline Phosphatase 56  39 - 117 U/L   Total Bilirubin <0.2 (*) 0.3 - 1.2 mg/dL   GFR calc non Af Amer >90  >90 mL/min   GFR calc Af Amer >90  >90 mL/min  ETHANOL      Result  Value Ref Range   Alcohol, Ethyl (B) <11  0 - 11 mg/dL     MDM  Labs.  Reviewed nursing notes and prior charts for additional history.   Psych team consulted.  Recheck awake and alert. Psych eval and dispo pending.   Psych team has completed their eval and states pt does not admit criteria for inpatient tx, not hopeless or severely depressed, no psychosis, no thoughts of harm to self or others, and  willing to follow up as outpt.  Psych team has recommended that pt f/u w psychiatrist at her counseling center to discuss medical management/titration, and follow up - pt agreeable. Psych team indicates pt able to get ride home first thing in morning.  Pt smiling, conversant w psych team and agreeable w plan.  Will also provide resource guide for additional community resources.     Suzi RootsKevin E Myriam Brandhorst, MD 08/31/13 2352

## 2013-08-31 NOTE — ED Notes (Signed)
Patient changed into scrubs, belongings placed at desk. Security called for belongings search and wanding.

## 2013-08-31 NOTE — ED Notes (Addendum)
Pt from home c/o depression. She states that she had baby 9 months ago, thinks she may have post-partum depression. Pt is a single mother and feels like" I want to bang my head against the wall, I'm exhausted." Pt denies SI/HI, but is seeking help for the depression. Pt states that "every sound he makes (her baby) makes me go into overdrive." Pt adds that she is very anxious. Pt is A&O and in NAD. Pt adds that she was on Lexapro during pregnancy. Pt also states that she has migraine that will not go away with Fioricet.

## 2013-08-31 NOTE — Consult Note (Signed)
  Consulted by TTS Kristy Campbell(Kristy Campbell) [place] [facility] [place] what sounds like situational adjustment DO around her 209 month old baby and hx of depression/anxiety without any SI/HI.Takes Xanax Fiorecet daily and rcently got [facility].Has counselor and hx of rx haldol-no current psychiatric care. [facility] [place] [facility] and that she spend tonite in ED away from baby while resources are searched to be provided at D/C in am -her mother with whom she lives is caring for the child/the father apparently is not involved.

## 2013-08-31 NOTE — ED Notes (Signed)
Patient reports that she is a single mother taking care of her 718 month old child (79 month old tomorrow) and that the child cries non-stop. Patient reports that she is so frustrated that she bangs her head against the wall. Patient denies wanting to hurt her child. Writer spoke with patient about safety with the child during times of stress. Patient currently denies SI/HI and A/V hallucinations. Patient states that she does have support system but she is with the child all day and it never stops crying due to colic and GERD.

## 2013-08-31 NOTE — ED Notes (Signed)
Patient wanded and belongings searched by security

## 2013-09-01 ENCOUNTER — Encounter (HOSPITAL_COMMUNITY): Payer: Self-pay | Admitting: Registered Nurse

## 2013-09-01 DIAGNOSIS — F3289 Other specified depressive episodes: Secondary | ICD-10-CM

## 2013-09-01 DIAGNOSIS — F53 Postpartum depression: Secondary | ICD-10-CM | POA: Diagnosis present

## 2013-09-01 DIAGNOSIS — F329 Major depressive disorder, single episode, unspecified: Secondary | ICD-10-CM

## 2013-09-01 DIAGNOSIS — F419 Anxiety disorder, unspecified: Secondary | ICD-10-CM | POA: Diagnosis present

## 2013-09-01 DIAGNOSIS — O99345 Other mental disorders complicating the puerperium: Secondary | ICD-10-CM

## 2013-09-01 DIAGNOSIS — F4322 Adjustment disorder with anxiety: Secondary | ICD-10-CM

## 2013-09-01 NOTE — ED Notes (Signed)
D/C instructions reviewed and resource materials provided. Stated understanding of follow up care. Her med from home was returned to her. Belongings bag x1 returned. No complaints voiced. Escorted by mental health tech to front of hospital.
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
## 2014-03-10 ENCOUNTER — Encounter (HOSPITAL_COMMUNITY): Payer: Self-pay | Admitting: Registered Nurse

## 2015-07-29 ENCOUNTER — Emergency Department (HOSPITAL_BASED_OUTPATIENT_CLINIC_OR_DEPARTMENT_OTHER)
Admission: EM | Admit: 2015-07-29 | Discharge: 2015-07-29 | Disposition: A | Discharge status: Home / Self Care | Payer: Medicaid Other | Attending: Emergency Medicine | Admitting: Emergency Medicine

## 2015-07-29 ENCOUNTER — Encounter (HOSPITAL_BASED_OUTPATIENT_CLINIC_OR_DEPARTMENT_OTHER): Payer: Self-pay | Admitting: Emergency Medicine

## 2015-07-29 DIAGNOSIS — Z3202 Encounter for pregnancy test, result negative: Secondary | ICD-10-CM | POA: Diagnosis not present

## 2015-07-29 DIAGNOSIS — N12 Tubulo-interstitial nephritis, not specified as acute or chronic: Secondary | ICD-10-CM | POA: Diagnosis not present

## 2015-07-29 DIAGNOSIS — F329 Major depressive disorder, single episode, unspecified: Secondary | ICD-10-CM | POA: Diagnosis not present

## 2015-07-29 DIAGNOSIS — F419 Anxiety disorder, unspecified: Secondary | ICD-10-CM | POA: Insufficient documentation

## 2015-07-29 DIAGNOSIS — Z8669 Personal history of other diseases of the nervous system and sense organs: Secondary | ICD-10-CM | POA: Insufficient documentation

## 2015-07-29 DIAGNOSIS — I10 Essential (primary) hypertension: Secondary | ICD-10-CM | POA: Insufficient documentation

## 2015-07-29 DIAGNOSIS — Z79899 Other long term (current) drug therapy: Secondary | ICD-10-CM | POA: Diagnosis not present

## 2015-07-29 DIAGNOSIS — M549 Dorsalgia, unspecified: Secondary | ICD-10-CM | POA: Diagnosis present

## 2015-07-29 LAB — URINE MICROSCOPIC-ADD ON

## 2015-07-29 LAB — URINALYSIS, ROUTINE W REFLEX MICROSCOPIC
Bilirubin Urine: NEGATIVE
Glucose, UA: NEGATIVE mg/dL
Ketones, ur: NEGATIVE mg/dL
Nitrite: NEGATIVE
PH: 6 (ref 5.0–8.0)
Protein, ur: NEGATIVE mg/dL
SPECIFIC GRAVITY, URINE: 1.03 (ref 1.005–1.030)

## 2015-07-29 LAB — PREGNANCY, URINE: PREG TEST UR: NEGATIVE

## 2015-07-29 MED ORDER — CIPROFLOXACIN HCL 500 MG PO TABS: 500.0000 mg | ORAL_TABLET | Freq: Two times a day (BID) | ORAL | Status: DC

## 2015-07-29 MED ORDER — ONDANSETRON HCL 4 MG PO TABS: 4.0000 mg | ORAL_TABLET | Freq: Four times a day (QID) | ORAL | Status: DC

## 2015-07-29 MED ORDER — ONDANSETRON 4 MG PO TBDP
4.0000 mg | ORAL_TABLET | Freq: Once | ORAL | Status: AC
  Administered 2015-07-29: 4 mg via ORAL
  Filled 2015-07-29: qty 1

## 2015-07-29 MED ORDER — CIPROFLOXACIN HCL 500 MG PO TABS
500.0000 mg | ORAL_TABLET | Freq: Once | ORAL | Status: AC
  Administered 2015-07-29: 500 mg via ORAL
  Filled 2015-07-29: qty 1

## 2015-07-29 NOTE — Discharge Instructions (Signed)
Schedule a follow up appointment with your PCP if your symptoms do not improve in 2-3 days. Return to ED with new, worsening or concerning symptoms.    Pyelonephritis, Adult Pyelonephritis is a kidney infection. The kidneys are the organs that filter a person's blood and move waste out of the bloodstream and into the urine. Urine passes from the kidneys, through the ureters, and into the bladder. There are two main types of pyelonephritis:  Infections that come on quickly without any warning (acute pyelonephritis).  Infections that last for a long period of time (chronic pyelonephritis). In most cases, the infection clears up with treatment and does not cause further problems. More severe infections or chronic infections can sometimes spread to the bloodstream or lead to other problems with the kidneys. CAUSES This condition is usually caused by:  Bacteria traveling from the bladder to the kidney through infected urine. The urine in the bladder can become infected with bacteria from:  Bladder infection (cystitis).  Inflammation of the prostate gland (prostatitis).  Sexual intercourse, in females.  Bacteria traveling from the bloodstream to the kidney. RISK FACTORS This condition is more likely to develop in:  Pregnant women.  Older people.  People who have diabetes.  People who have kidney stones or bladder stones.  People who have other abnormalities of the kidney or ureter.  People who have a catheter placed in the bladder.  People who have cancer.  People who are sexually active.  Women who use spermicides.  People who have had a prior urinary tract infection. SYMPTOMS Symptoms of this condition include:  Frequent urination.  Strong or persistent urge to urinate.  Burning or stinging when urinating.  Abdominal pain.  Back pain.  Pain in the side or flank area.  Fever.  Chills.  Blood in the urine, or dark  urine.  Nausea.  Vomiting. DIAGNOSIS This condition may be diagnosed based on:  Medical history and physical exam.  Urine tests.  Blood tests. You may also have imaging tests of the kidneys, such as an ultrasound or CT scan. TREATMENT Treatment for this condition may depend on the severity of the infection.  If the infection is mild and is found early, you may be treated with antibiotic medicines taken by mouth. You will need to drink fluids to remain hydrated.  If the infection is more severe, you may need to stay in the hospital and receive antibiotics given directly into a vein through an IV tube. You may also need to receive fluids through an IV tube if you are not able to remain hydrated. After your hospital stay, you may need to take oral antibiotics for a period of time. Other treatments may be required, depending on the cause of the infection. HOME CARE INSTRUCTIONS Medicines  Take over-the-counter and prescription medicines only as told by your health care provider.  If you were prescribed an antibiotic medicine, take it as told by your health care provider. Do not stop taking the antibiotic even if you start to feel better. General Instructions  Drink enough fluid to keep your urine clear or pale yellow.  Avoid caffeine, tea, and carbonated beverages. They tend to irritate the bladder.  Urinate often. Avoid holding in urine for long periods of time.  Urinate before and after sex.  After a bowel movement, women should cleanse from front to back. Use each tissue only once.  Keep all follow-up visits as told by your health care provider. This is important. SEEK MEDICAL CARE IF:  Your symptoms do not get better after 2 days of treatment. °· Your symptoms get worse. °· You have a fever. °SEEK IMMEDIATE MEDICAL CARE IF: °· You are unable to take your antibiotics or fluids. °· You have shaking chills. °· You vomit. °· You have severe flank or back pain. °· You have  extreme weakness or fainting. °  °This information is not intended to replace advice given to you by your health care provider. Make sure you discuss any questions you have with your health care provider. °  °Document Released: 04/25/2005 Document Revised: 01/14/2015 Document Reviewed: 08/18/2014 °Elsevier Interactive Patient Education ©2016 [employer] ° °

## 2015-07-29 NOTE — ED Notes (Signed)
Patient states that she is having lower back pain that is worse when she goes to the restroom. The patient is having urinary frequency. The patient reports that she is having this for the last 3 days

## 2015-07-29 NOTE — ED Provider Notes (Signed)
CSN: [id number]     Arrival date & time 07/29/15  1620 History   First MD Initiated Contact with Patient 07/29/15 2003     Chief Complaint  Patient presents with  . Back Pain   HPI  Kristy Campbell is a 30 year old female with a past medical history of HTN, depression and anxiety presenting with left flank pain and increased urinary frequency. Onset of symptoms was 3 days ago. She endorses an aching left flank pain that is moderate in severity. The flank pain does not radiate. The pain is exacerbated by urination. She notes that she is urinating frequently and feels that she never fully empties her bladder. She denies dysuria or hematuria. She denies vaginal discharge or pelvic pain. She has not tried any medications at home. She does endorse mild nausea but states that she has been anxious over an upcoming court date with her ex husband and believes this may be the cause of the nausea. Denies fevers, chills, lightheadedness, syncope, abdominal pain, vomiting, diarrhea, rashes or trauma to the flank.  Past Medical History  Diagnosis Date  . Mental disorder   . Anxiety   . Depression   . Neuromuscular disorder (HCC)   . Hypertension     Dx 21  . Gestational diabetes    Past Surgical History  Procedure Laterality Date  . Cesarean section N/A 12/01/2012    Procedure: Primary Cesarean Section Delivery Baby Boy  @ [phone],    Apgars 9/9;  Surgeon: Tilda Burrow, MD;  Location: [facility];  Service: Obstetrics;  Laterality: N/A;   History reviewed. No pertinent family history. Social History  Substance Use Topics  . Smoking status: Never Smoker   . Smokeless tobacco: None  . Alcohol Use: No   OB History    Gravida Para Term Preterm AB TAB SAB Ectopic Multiple Living       Review of Systems  All other systems reviewed and are negative.     Allergies  Haldol and Imitrex  Home Medications   Prior to Admission medications   Medication Sig Start Date End Date Taking?  Authorizing Provider  escitalopram (LEXAPRO) 20 MG tablet Take 40 mg by mouth daily.   Yes Historical Provider, MD  ALPRAZolam Prudy Feeler) 1 MG tablet Take 1 mg by mouth 2 (two) times daily as needed for anxiety (anxiety).    Historical Provider, MD  amphetamine-dextroamphetamine (ADDERALL XR) 15 MG 24 hr capsule Take 15 mg by mouth daily.    Historical Provider, MD  ATENOLOL PO Take 1 tablet by mouth.    Historical Provider, MD  butalbital-acetaminophen-caffeine (FIORICET, ESGIC) 50-325-40 MG per tablet Take 1 tablet by mouth 2 (two) times daily as needed for headache (headache).    Historical Provider, MD  ciprofloxacin (CIPRO) 500 MG tablet Take 1 tablet (500 mg total) by mouth 2 (two) times daily. 07/29/15   Nela Bascom, PA-C  citalopram (CELEXA) 20 MG tablet Take 20 mg by mouth daily.    Historical Provider, MD  ondansetron (ZOFRAN) 4 MG tablet Take 1 tablet (4 mg total) by mouth every 6 (six) hours. 07/29/15   Sophronia Varney, PA-C  ondansetron (ZOFRAN) 8 MG tablet Take by mouth every 8 (eight) hours as needed for nausea or vomiting (nausea).    Historical Provider, MD  oxyCODONE-acetaminophen (PERCOCET/ROXICET) 5-325 MG per tablet Take 1-2 tablets by mouth every 4 (four) hours as needed for severe pain (pain).    Historical Provider,  MD   BP 136/100 mmHg  Pulse 100  Temp(Src) 98.5 F (36.9 C) (Oral)  Resp 18  Ht 4\' 11"  (1.499 m)  Wt 46.267 kg  BMI 20.59 kg/m2  SpO2 99%  LMP 06/30/2015 Physical Exam  Constitutional: She appears well-developed and well-nourished. No distress.  Nontoxic-appearing  HENT:  Head: Normocephalic and atraumatic.  Eyes: Conjunctivae and EOM are normal. Right eye exhibits no discharge. Left eye exhibits no discharge. No scleral icterus.  Neck: Normal range of motion. Neck supple.  Cardiovascular: Normal rate and regular rhythm.   Pulmonary/Chest: Effort normal and breath sounds normal. No respiratory distress.  Abdominal: Soft. She exhibits no distension. There  is tenderness in the suprapubic area. There is CVA tenderness. There is no rigidity, no rebound and no guarding.  Left sided CVA tenderness  Musculoskeletal: Normal range of motion.  Neurological: She is alert. Coordination normal.  Skin: Skin is warm and dry.  No rashes over the left flank  Psychiatric: She has a normal mood and affect. Her behavior is normal.  Nursing note and vitals reviewed.   ED Course  Procedures (including critical care time) Labs Review Labs Reviewed  URINALYSIS, ROUTINE W REFLEX MICROSCOPIC (NOT AT [facility]) - Abnormal; Notable for the following:    APPearance CLOUDY (*)    Hgb urine dipstick LARGE (*)    Leukocytes, UA TRACE (*)    All other components within normal limits  URINE MICROSCOPIC-ADD ON - Abnormal; Notable for the following:    Squamous Epithelial / LPF 6-30 (*)    Bacteria, UA MANY (*)    All other components within normal limits  PREGNANCY, URINE    Imaging Review No results found. I have personally reviewed and evaluated these images and lab results as part of my medical decision-making.   EKG Interpretation None      MDM   Final diagnoses:  Pyelonephritis   30 year old female presenting with left flank pain and increased urinary frequency 3 days. Patient is afebrile and nontoxic appearing. Left-sided CVA tenderness positive. No overlying skin changes. Mild suprapubic abdominal tenderness without peritoneal signs. Urine positive for trace leukocytes and many bacteria. Presentation consistent with uncomplicated pyelonephritis. Patient does not meet SIRS criteria and is appropriate for outpatient treatment. Will discharge with ciprofloxacin and Zofran for nausea. Patient is to follow-up with her PCP in 2-3 days if her symptoms do not improve. Return precautions given in discharge paperwork and discussed with pt at bedside. Pt stable for discharge     Alveta HeimlichStevi Jaymien Landin, PA-C 07/29/15 2148  Loren Raceravid Yelverton, MD 07/30/15 [phone]

## 2015-08-26 ENCOUNTER — Emergency Department (HOSPITAL_COMMUNITY)
Admission: EM | Admit: 2015-08-26 | Discharge: 2015-08-26 | Disposition: A | Discharge status: Home / Self Care | Payer: Medicaid Other | Attending: Emergency Medicine | Admitting: Emergency Medicine

## 2015-08-26 ENCOUNTER — Encounter (HOSPITAL_COMMUNITY): Payer: Self-pay | Admitting: *Deleted

## 2015-08-26 DIAGNOSIS — Y93E1 Activity, personal bathing and showering: Secondary | ICD-10-CM | POA: Diagnosis not present

## 2015-08-26 DIAGNOSIS — S39012A Strain of muscle, fascia and tendon of lower back, initial encounter: Secondary | ICD-10-CM | POA: Insufficient documentation

## 2015-08-26 DIAGNOSIS — Z8632 Personal history of gestational diabetes: Secondary | ICD-10-CM | POA: Insufficient documentation

## 2015-08-26 DIAGNOSIS — W182XXA Fall in (into) shower or empty bathtub, initial encounter: Secondary | ICD-10-CM | POA: Insufficient documentation

## 2015-08-26 DIAGNOSIS — F329 Major depressive disorder, single episode, unspecified: Secondary | ICD-10-CM | POA: Diagnosis not present

## 2015-08-26 DIAGNOSIS — Y92002 Bathroom of unspecified non-institutional (private) residence single-family (private) house as the place of occurrence of the external cause: Secondary | ICD-10-CM | POA: Diagnosis not present

## 2015-08-26 DIAGNOSIS — Z79899 Other long term (current) drug therapy: Secondary | ICD-10-CM | POA: Diagnosis not present

## 2015-08-26 DIAGNOSIS — I10 Essential (primary) hypertension: Secondary | ICD-10-CM | POA: Insufficient documentation

## 2015-08-26 DIAGNOSIS — T148XXA Other injury of unspecified body region, initial encounter: Secondary | ICD-10-CM

## 2015-08-26 DIAGNOSIS — M546 Pain in thoracic spine: Secondary | ICD-10-CM

## 2015-08-26 DIAGNOSIS — Y998 Other external cause status: Secondary | ICD-10-CM | POA: Insufficient documentation

## 2015-08-26 DIAGNOSIS — F419 Anxiety disorder, unspecified: Secondary | ICD-10-CM | POA: Diagnosis not present

## 2015-08-26 DIAGNOSIS — S29002A Unspecified injury of muscle and tendon of back wall of thorax, initial encounter: Secondary | ICD-10-CM | POA: Diagnosis present

## 2015-08-26 MED ORDER — METHOCARBAMOL 500 MG PO TABS: 500.0000 mg | ORAL_TABLET | Freq: Two times a day (BID) | ORAL | Status: AC

## 2015-08-26 NOTE — ED Notes (Signed)
The pt is c/o back from the waist up.. She fell in the br yesterday.  lmp  Due in 5 days

## 2015-08-26 NOTE — ED Provider Notes (Signed)
CSN: [id number]     Arrival date & time 08/26/15  1502 History  By signing my name below, I, Freida Busman, attest that this documentation has been prepared under the direction and in the presence of non-physician practitioner, Roxy Horseman, PA-C. Electronically Signed: Freida Busman, Scribe. 08/26/2015. 3:30 PM.  Chief Complaint  Patient presents with  . Back Pain   The history is provided by the patient. No language interpreter was used.    HPI Comments:  Kristy Campbell is a 30 y.o. female who presents to the Emergency Department complaining of constant moderate upper/mid back pain s/p fall yesterday. Pt states she fell getting into the shower yesterday and struck the side of the tub. She has taken tylenol and ibuprofen with little relief.   Past Medical History  Diagnosis Date  . Mental disorder   . Anxiety   . Depression   . Neuromuscular disorder (HCC)   . Hypertension     Dx 21  . Gestational diabetes    Past Surgical History  Procedure Laterality Date  . Cesarean section N/A 12/01/2012    Procedure: Primary Cesarean Section Delivery Baby Boy  @ [phone],    Apgars 9/9;  Surgeon: Tilda Burrow, MD;  Location: [facility];  Service: Obstetrics;  Laterality: N/A;   No family history on file. Social History  Substance Use Topics  . Smoking status: Never Smoker   . Smokeless tobacco: None  . Alcohol Use: No   OB History    Gravida Para Term Preterm AB TAB SAB Ectopic Multiple Living       Review of Systems  Musculoskeletal: Positive for back pain.  Neurological: Negative for syncope, weakness and numbness.     Allergies  Haldol and Imitrex  Home Medications   Prior to Admission medications   Medication Sig Start Date End Date Taking? Authorizing Provider  ALPRAZolam Prudy Feeler) 1 MG tablet Take 1 mg by mouth 2 (two) times daily as needed for anxiety (anxiety).    Historical Provider, MD  amphetamine-dextroamphetamine (ADDERALL XR) 15 MG 24 hr capsule  Take 15 mg by mouth daily.    Historical Provider, MD  ATENOLOL PO Take 1 tablet by mouth.    Historical Provider, MD  butalbital-acetaminophen-caffeine (FIORICET, ESGIC) 50-325-40 MG per tablet Take 1 tablet by mouth 2 (two) times daily as needed for headache (headache).    Historical Provider, MD  ciprofloxacin (CIPRO) 500 MG tablet Take 1 tablet (500 mg total) by mouth 2 (two) times daily. 07/29/15   Stevi Barrett, PA-C  citalopram (CELEXA) 20 MG tablet Take 20 mg by mouth daily.    Historical Provider, MD  escitalopram (LEXAPRO) 20 MG tablet Take 40 mg by mouth daily.    Historical Provider, MD  ondansetron (ZOFRAN) 4 MG tablet Take 1 tablet (4 mg total) by mouth every 6 (six) hours. 07/29/15   Stevi Barrett, PA-C  ondansetron (ZOFRAN) 8 MG tablet Take by mouth every 8 (eight) hours as needed for nausea or vomiting (nausea).    Historical Provider, MD  oxyCODONE-acetaminophen (PERCOCET/ROXICET) 5-325 MG per tablet Take 1-2 tablets by mouth every 4 (four) hours as needed for severe pain (pain).    Historical Provider, MD   BP 123/90 mmHg  Pulse 75  Temp(Src) 98.3 F (36.8 C)  Resp 16  Ht  (1.499 m)  Wt 102 lb 3 oz (46.352 kg)  BMI 20.63 kg/m2  SpO2 100%  LMP 07/28/2015 Physical Exam  Constitutional: She is oriented to person, place, and time. She appears well-developed and well-nourished. No distress.  HENT:  Head: Normocephalic and atraumatic.  Eyes: Conjunctivae and EOM are normal. Right eye exhibits no discharge. Left eye exhibits no discharge. No scleral icterus.  Neck: Normal range of motion. Neck supple. No tracheal deviation present.  Cardiovascular: Normal rate, regular rhythm and normal heart sounds.  Exam reveals no gallop and no friction rub.   No murmur heard. Pulmonary/Chest: Effort normal and breath sounds normal. No respiratory distress. She has no wheezes.  Abdominal: Soft. She exhibits no distension. There is no tenderness.  Musculoskeletal: Normal range of  motion.  Thoracic paraspinal and rhomboid muscles tender to palpation, no bony tenderness, step-offs, or gross abnormality or deformity of spine, patient is able to ambulate, moves all extremities    Neurological: She is alert and oriented to person, place, and time.  Sensation and strength intact bilaterally   Skin: Skin is warm. She is not diaphoretic.  Psychiatric: She has a normal mood and affect. Her behavior is normal. Judgment and thought content normal.  Nursing note and vitals reviewed.   ED Course  Procedures  DIAGNOSTIC STUDIES:  Oxygen Saturation is 100% on RA, normal by my interpretation.    COORDINATION OF CARE:  3:26 PM Discussed treatment plan with pt at bedside and pt agreed to plan.    MDM   Final diagnoses:  Bilateral thoracic back pain  Muscle strain    Patient with back pain after a fall. Symptoms are consistent with muscle strain of the thoracic paraspinal and rhomboid muscle groups. She has no bony tenderness. She is neurovascularly intact. She is able to ambulate and moves all extremities. No neurological deficits and normal neuro exam.  Patient is ambulatory.  No loss of bowel or bladder control.  Doubt cauda equina.  Denies fever,  doubt epidural abscess or other lesion. Recommend back exercises, stretching, RICE, and will treat with a short course of Robaxin.   I personally performed the services described in this documentation, which was scribed in my presence. The recorded information has been reviewed and is accurate.     Roxy Horsemanobert Dafina Suk, PA-C 08/26/15 1537  Zadie Rhineonald Wickline, MD 08/27/15 [phone]

## 2015-08-26 NOTE — Discharge Instructions (Signed)
Back Pain, Adult  Back pain is very common in adults.The cause of back pain is rarely dangerous and the pain often gets better over time.The cause of your back pain may not be known. Some common causes of back pain include:   Strain of the muscles or ligaments supporting the spine.   Wear and tear (degeneration) of the spinal disks.   Arthritis.   Direct injury to the back.  For many people, back pain may return. Since back pain is rarely dangerous, most people can learn to manage this condition on their own.  HOME CARE INSTRUCTIONS  Watch your back pain for any changes. The following actions may help to lessen any discomfort you are feeling:   Remain active. It is stressful on your back to sit or stand in one place for long periods of time. Do not sit, drive, or stand in one place for more than 30 minutes at a time. Take short walks on even surfaces as soon as you are able.Try to increase the length of time you walk each day.   Exercise regularly as directed by your health care provider. Exercise helps your back heal faster. It also helps avoid future injury by keeping your muscles strong and flexible.   Do not stay in bed.Resting more than 1-2 days can delay your recovery.   Pay attention to your body when you bend and lift. The most comfortable positions are those that put less stress on your recovering back. Always use proper lifting techniques, including:    Bending your knees.    Keeping the load close to your body.    Avoiding twisting.   Find a comfortable position to sleep. Use a firm mattress and lie on your side with your knees slightly bent. If you lie on your back, put a pillow under your knees.   Avoid feeling anxious or stressed.Stress increases muscle tension and can worsen back pain.It is important to recognize when you are anxious or stressed and learn ways to manage it, such as with exercise.   Take medicines only as directed by your health care provider. Over-the-counter  medicines to reduce pain and inflammation are often the most helpful.Your health care provider may prescribe muscle relaxant drugs.These medicines help dull your pain so you can more quickly return to your normal activities and healthy exercise.   Apply ice to the injured area:    Put ice in a plastic bag.    Place a towel between your skin and the bag.    Leave the ice on for 20 minutes, 2-3 times a day for the first 2-3 days. After that, ice and heat may be alternated to reduce pain and spasms.   Maintain a healthy weight. Excess weight puts extra stress on your back and makes it difficult to maintain good posture.  SEEK MEDICAL CARE IF:   You have pain that is not relieved with rest or medicine.   You have increasing pain going down into the legs or buttocks.   You have pain that does not improve in one week.   You have night pain.   You lose weight.   You have a fever or chills.  SEEK IMMEDIATE MEDICAL CARE IF:    You develop new bowel or bladder control problems.   You have unusual weakness or numbness in your arms or legs.   You develop nausea or vomiting.   You develop abdominal pain.   You feel faint.     This information   is not intended to replace advice given to you by your health care provider. Make sure you discuss any questions you have with your health care provider.     Document Released: 04/25/2005 Document Revised: 05/16/2014 Document Reviewed: 08/27/2013  Elsevier Interactive Patient Education 2016 [employer]  Muscle Strain  A muscle strain is an injury that occurs when a muscle is stretched beyond its normal length. Usually a small number of muscle fibers are torn when this happens. Muscle strain is rated in degrees. First-degree strains have the least amount of muscle fiber tearing and pain. Second-degree and third-degree strains have increasingly more tearing and pain.   Usually, recovery from muscle strain takes 1-2 weeks. Complete healing takes 5-6 weeks.   CAUSES   Muscle  strain happens when a sudden, violent force placed on a muscle stretches it too far. This may occur with lifting, sports, or a fall.   RISK FACTORS  Muscle strain is especially common in athletes.   SIGNS AND SYMPTOMS  At the site of the muscle strain, there may be:   Pain.   Bruising.   Swelling.   Difficulty using the muscle due to pain or lack of normal function.  DIAGNOSIS   Your health care provider will perform a physical exam and ask about your medical history.  TREATMENT   Often, the best treatment for a muscle strain is resting, icing, and applying cold compresses to the injured area.   HOME CARE INSTRUCTIONS    Use the PRICE method of treatment to promote muscle healing during the first 2-3 days after your injury. The PRICE method involves:    Protecting the muscle from being injured again.    Restricting your activity and resting the injured body part.    Icing your injury. To do this, put ice in a plastic bag. Place a towel between your skin and the bag. Then, apply the ice and leave it on from 15-20 minutes each hour. After the third day, switch to moist heat packs.    Apply compression to the injured area with a splint or elastic bandage. Be careful not to wrap it too tightly. This may interfere with blood circulation or increase swelling.    Elevate the injured body part above the level of your heart as often as you can.   Only take over-the-counter or prescription medicines for pain, discomfort, or fever as directed by your health care provider.   Warming up prior to exercise helps to prevent future muscle strains.  SEEK MEDICAL CARE IF:    You have increasing pain or swelling in the injured area.   You have numbness, tingling, or a significant loss of strength in the injured area.  MAKE SURE YOU:    Understand these instructions.   Will watch your condition.   Will get help right away if you are not doing well or get worse.     This information is not intended to replace advice given to  you by your health care provider. Make sure you discuss any questions you have with your health care provider.     Document Released: 04/25/2005 Document Revised: 02/13/2013 Document Reviewed: 11/22/2012  Elsevier Interactive Patient Education 2016 [employer]

## 2015-10-25 DIAGNOSIS — G40409 Other generalized epilepsy and epileptic syndromes, not intractable, without status epilepticus: Secondary | ICD-10-CM

## 2015-10-25 DIAGNOSIS — F419 Anxiety disorder, unspecified: Secondary | ICD-10-CM

## 2015-10-25 DIAGNOSIS — K219 Gastro-esophageal reflux disease without esophagitis: Secondary | ICD-10-CM

## 2015-10-25 DIAGNOSIS — I10 Essential (primary) hypertension: Secondary | ICD-10-CM | POA: Diagnosis not present

## 2015-10-25 DIAGNOSIS — Q8500 Neurofibromatosis, unspecified: Secondary | ICD-10-CM | POA: Diagnosis not present

## 2015-10-26 DIAGNOSIS — G40409 Other generalized epilepsy and epileptic syndromes, not intractable, without status epilepticus: Secondary | ICD-10-CM

## 2015-10-26 DIAGNOSIS — K219 Gastro-esophageal reflux disease without esophagitis: Secondary | ICD-10-CM | POA: Diagnosis not present

## 2015-10-26 DIAGNOSIS — Q8500 Neurofibromatosis, unspecified: Secondary | ICD-10-CM | POA: Diagnosis not present

## 2015-10-26 DIAGNOSIS — F419 Anxiety disorder, unspecified: Secondary | ICD-10-CM

## 2015-10-26 DIAGNOSIS — I10 Essential (primary) hypertension: Secondary | ICD-10-CM | POA: Diagnosis not present

## 2015-11-02 ENCOUNTER — Emergency Department (HOSPITAL_COMMUNITY): Payer: Medicaid Other

## 2015-11-02 ENCOUNTER — Emergency Department (HOSPITAL_COMMUNITY)
Admission: EM | Admit: 2015-11-02 | Discharge: 2015-11-02 | Disposition: A | Discharge status: Home / Self Care | Payer: Medicaid Other | Attending: Emergency Medicine | Admitting: Emergency Medicine

## 2015-11-02 ENCOUNTER — Encounter (HOSPITAL_COMMUNITY): Payer: Self-pay | Admitting: *Deleted

## 2015-11-02 DIAGNOSIS — Z79899 Other long term (current) drug therapy: Secondary | ICD-10-CM | POA: Insufficient documentation

## 2015-11-02 DIAGNOSIS — I10 Essential (primary) hypertension: Secondary | ICD-10-CM | POA: Insufficient documentation

## 2015-11-02 DIAGNOSIS — R079 Chest pain, unspecified: Secondary | ICD-10-CM

## 2015-11-02 DIAGNOSIS — R0789 Other chest pain: Secondary | ICD-10-CM | POA: Diagnosis not present

## 2015-11-02 MED ORDER — IBUPROFEN 400 MG PO TABS
600.0000 mg | ORAL_TABLET | Freq: Once | ORAL | Status: AC
  Administered 2015-11-02: 600 mg via ORAL
  Filled 2015-11-02: qty 1

## 2015-11-02 MED ORDER — HYDROCODONE-ACETAMINOPHEN 5-325 MG PO TABS
1.0000 | ORAL_TABLET | Freq: Once | ORAL | Status: AC
  Administered 2015-11-02: 1 via ORAL
  Filled 2015-11-02: qty 1

## 2015-11-02 MED ORDER — IBUPROFEN 400 MG PO TABS: 400.0000 mg | ORAL_TABLET | Freq: Three times a day (TID) | ORAL | Status: AC

## 2015-11-02 NOTE — ED Provider Notes (Signed)
CSN: [id number]     Arrival date & time 11/02/15  0235 History  By signing my name below, I, Kristy Campbell, attest that this documentation has been prepared under the direction and in the presence of Azalia BilisKevin Ulyess Muto, MD.  Electronically Signed: Octavia HeirArianna Campbell, ED Scribe. 11/02/2015. 3:44 AM.     Chief Complaint  Patient presents with  . Rib Injury      The history is provided by the patient. No language interpreter was used.   HPI Comments: Kristy Campbell is a 30 y.o. female who has a PMHx of anxiety, depression, neuromuscular disorder, HTN and gestational diabetes presents to the Emergency Department complaining of constant, gradual worsening, moderate, mid-chest pain and right upper rib pain onset one week ago. She reports increased pain when taking a deep breath and laying down. She notes difficulty picking up her toddler due to pain as well. Pt says she had a first time seizure last Saturday and her mother performed CPR which she notes she has been having this discomfort since. She is currently on Keppra. Pt denies any other symptoms or complaints.   Past Medical History  Diagnosis Date  . Mental disorder   . Anxiety   . Depression   . Neuromuscular disorder (HCC)   . Hypertension     Dx 21  . Gestational diabetes    Past Surgical History  Procedure Laterality Date  . Cesarean section N/A 12/01/2012    Procedure: Primary Cesarean Section Delivery Baby Boy  @ [phone],    Apgars 9/9;  Surgeon: Tilda BurrowJohn V Ferguson, MD;  Location: [facility];  Service: Obstetrics;  Laterality: N/A;   No family history on file. Social History  Substance Use Topics  . Smoking status: Never Smoker   . Smokeless tobacco: None  . Alcohol Use: No   OB History    Gravida Para Term Preterm AB TAB SAB Ectopic Multiple Living   1 1 0 1 0 0 0 0 0 0      Review of Systems  A complete 10 system review of systems was obtained and all systems are negative except as noted in the HPI and PMH.    Allergies  Haldol and  Imitrex  Home Medications   Prior to Admission medications   Medication Sig Start Date End Date Taking? Authorizing Provider  ALPRAZolam Prudy Feeler(XANAX) 1 MG tablet Take 1 mg by mouth 2 (two) times daily as needed for anxiety (anxiety).    Historical Provider, MD  amphetamine-dextroamphetamine (ADDERALL XR) 15 MG 24 hr capsule Take 15 mg by mouth daily.    Historical Provider, MD  ATENOLOL PO Take 1 tablet by mouth.    Historical Provider, MD  butalbital-acetaminophen-caffeine (FIORICET, ESGIC) 50-325-40 MG per tablet Take 1 tablet by mouth 2 (two) times daily as needed for headache (headache).    Historical Provider, MD  ciprofloxacin (CIPRO) 500 MG tablet Take 1 tablet (500 mg total) by mouth 2 (two) times daily. 07/29/15   Stevi Barrett, PA-C  citalopram (CELEXA) 20 MG tablet Take 20 mg by mouth daily.    Historical Provider, MD  escitalopram (LEXAPRO) 20 MG tablet Take 40 mg by mouth daily.    Historical Provider, MD  methocarbamol (ROBAXIN) 500 MG tablet Take 1 tablet (500 mg total) by mouth 2 (two) times daily. 08/26/15   Roxy Horsemanobert Browning, PA-C  ondansetron (ZOFRAN) 4 MG tablet Take 1 tablet (4 mg total) by mouth every 6 (six) hours. 07/29/15   Stevi Barrett, PA-C  ondansetron (ZOFRAN) 8 MG tablet  Take by mouth every 8 (eight) hours as needed for nausea or vomiting (nausea).    Historical Provider, MD  oxyCODONE-acetaminophen (PERCOCET/ROXICET) 5-325 MG per tablet Take 1-2 tablets by mouth every 4 (four) hours as needed for severe pain (pain).    Historical Provider, MD   BP 116/78 mmHg  Pulse 65  Temp(Src) 97.5 F (36.4 C) (Oral)  Resp 20  Ht 4\' 11"  (1.499 m)  Wt 100 lb 5 oz (45.501 kg)  BMI 20.25 kg/m2  SpO2 100%  LMP 10/26/2015 Physical Exam  Constitutional: She is oriented to person, place, and time. She appears well-developed and well-nourished. No distress.  HENT:  Head: Normocephalic and atraumatic.  Eyes: EOM are normal.  Neck: Normal range of motion.  Cardiovascular: Normal rate,  regular rhythm and normal heart sounds.   Pulmonary/Chest: Effort normal and breath sounds normal. She exhibits tenderness.  Mild anterior and right lateral chest tenderness  Abdominal: Soft. She exhibits no distension. There is no tenderness.  Musculoskeletal: Normal range of motion.  Neurological: She is alert and oriented to person, place, and time.  Skin: Skin is warm and dry.  Psychiatric: She has a normal mood and affect. Judgment normal.  Nursing note and vitals reviewed.   ED Course  Procedures  DIAGNOSTIC STUDIES: Oxygen Saturation is 100% on RA, normal by my interpretation.  COORDINATION OF CARE:  3:43 AM Discussed treatment plan with pt at bedside and pt agreed to plan.  Labs Review Labs Reviewed - No data to display  Imaging Review Dg Chest 2 View  11/02/2015  CLINICAL DATA:  30 year old female with chest pain EXAM: CHEST  2 VIEW COMPARISON:  Chest radiograph dated 10/25/2015 FINDINGS: The heart size and mediastinal contours are within normal limits. Both lungs are clear. The visualized skeletal structures are unremarkable. IMPRESSION: No active cardiopulmonary disease. Electronically Signed   By: Elgie CollardArash  Radparvar M.D.   On: 11/02/2015 03:39   I have personally reviewed and evaluated these images and lab results as part of my medical decision-making.   EKG Interpretation None      MDM   Final diagnoses:  Chest pain, unspecified chest pain type    Likely chest wall injury from CPR.  Doubt intra-abdominal pathology.  Chest x-ray without rib fracture or pulmonary contusion.  Pulse ox is 100%.  Pulse rate is normal.  Home with anti-inflammatories and primary care follow-up   I personally performed the services described in this documentation, which was scribed in my presence. The recorded information has been reviewed and is accurate.      Azalia BilisKevin Ardell Makarewicz, MD 11/02/15 [phone]

## 2015-11-02 NOTE — ED Notes (Signed)
Patient verbalized understanding of discharge instructions and denies any further needs or questions at this time. VS stable. Patient ambulatory with steady gait.  

## 2015-11-02 NOTE — ED Notes (Signed)
The pt report that she had a first time seizure last Saturday a week ago  Her mother did cpr on her and since then   She has had mid-chest soreness and anterior rib pain  Bi-laterally since then worse with movement.  lmp last week

## 2015-12-06 ENCOUNTER — Emergency Department (HOSPITAL_COMMUNITY)
Admission: EM | Admit: 2015-12-06 | Discharge: 2015-12-06 | Disposition: A | Discharge status: Home / Self Care | Payer: Medicaid Other | Attending: Emergency Medicine | Admitting: Emergency Medicine

## 2015-12-06 ENCOUNTER — Encounter (HOSPITAL_COMMUNITY): Payer: Self-pay | Admitting: Emergency Medicine

## 2015-12-06 DIAGNOSIS — I10 Essential (primary) hypertension: Secondary | ICD-10-CM | POA: Diagnosis not present

## 2015-12-06 DIAGNOSIS — F32A Depression, unspecified: Secondary | ICD-10-CM

## 2015-12-06 DIAGNOSIS — Z79899 Other long term (current) drug therapy: Secondary | ICD-10-CM | POA: Diagnosis not present

## 2015-12-06 DIAGNOSIS — Z791 Long term (current) use of non-steroidal anti-inflammatories (NSAID): Secondary | ICD-10-CM | POA: Diagnosis not present

## 2015-12-06 DIAGNOSIS — F418 Other specified anxiety disorders: Secondary | ICD-10-CM | POA: Insufficient documentation

## 2015-12-06 DIAGNOSIS — F329 Major depressive disorder, single episode, unspecified: Secondary | ICD-10-CM

## 2015-12-06 DIAGNOSIS — F419 Anxiety disorder, unspecified: Secondary | ICD-10-CM

## 2015-12-06 HISTORY — DX: Unspecified convulsions: R56.9

## 2015-12-06 LAB — COMPREHENSIVE METABOLIC PANEL
ALK PHOS: 60 U/L (ref 38–126)
ALT: 12 U/L — AB (ref 14–54)
ANION GAP: 7 (ref 5–15)
AST: 14 U/L — ABNORMAL LOW (ref 15–41)
Albumin: 4.5 g/dL (ref 3.5–5.0)
BILIRUBIN TOTAL: 0.4 mg/dL (ref 0.3–1.2)
BUN: 14 mg/dL (ref 6–20)
CALCIUM: 9.3 mg/dL (ref 8.9–10.3)
CO2: 24 mmol/L (ref 22–32)
CREATININE: 0.65 mg/dL (ref 0.44–1.00)
Chloride: 106 mmol/L (ref 101–111)
Glucose, Bld: 115 mg/dL — ABNORMAL HIGH (ref 65–99)
Potassium: 4 mmol/L (ref 3.5–5.1)
SODIUM: 137 mmol/L (ref 135–145)
TOTAL PROTEIN: 7.9 g/dL (ref 6.5–8.1)

## 2015-12-06 LAB — CBC
HCT: 37.3 % (ref 36.0–46.0)
Hemoglobin: 13 g/dL (ref 12.0–15.0)
MCH: 30 pg (ref 26.0–34.0)
MCHC: 34.9 g/dL (ref 30.0–36.0)
MCV: 86.1 fL (ref 78.0–100.0)
PLATELETS: 685 10*3/uL — AB (ref 150–400)
RBC: 4.33 MIL/uL (ref 3.87–5.11)
RDW: 12.9 % (ref 11.5–15.5)
WBC: 8.5 10*3/uL (ref 4.0–10.5)

## 2015-12-06 LAB — RAPID URINE DRUG SCREEN, HOSP PERFORMED
AMPHETAMINES: NOT DETECTED
BENZODIAZEPINES: POSITIVE — AB
Barbiturates: POSITIVE — AB
COCAINE: POSITIVE — AB
OPIATES: NOT DETECTED
Tetrahydrocannabinol: NOT DETECTED

## 2015-12-06 LAB — ETHANOL

## 2015-12-06 MED ORDER — ACETAMINOPHEN 325 MG PO TABS
650.0000 mg | ORAL_TABLET | ORAL | Status: DC | PRN
  Administered 2015-12-06: 650 mg via ORAL
  Filled 2015-12-06: qty 2

## 2015-12-06 MED ORDER — ZOLPIDEM TARTRATE 5 MG PO TABS: 5.0000 mg | ORAL_TABLET | Freq: Every evening | ORAL | Status: DC | PRN

## 2015-12-06 MED ORDER — PROPRANOLOL HCL 80 MG PO TABS
80.0000 mg | ORAL_TABLET | Freq: Two times a day (BID) | ORAL | Status: DC
  Filled 2015-12-06: qty 1

## 2015-12-06 MED ORDER — LORAZEPAM 1 MG PO TABS: 1.0000 mg | ORAL_TABLET | Freq: Three times a day (TID) | ORAL | Status: DC | PRN

## 2015-12-06 MED ORDER — ONDANSETRON HCL 4 MG PO TABS
4.0000 mg | ORAL_TABLET | Freq: Three times a day (TID) | ORAL | Status: DC | PRN
  Administered 2015-12-06: 4 mg via ORAL
  Filled 2015-12-06: qty 1

## 2015-12-06 MED ORDER — IBUPROFEN 200 MG PO TABS: 600.0000 mg | ORAL_TABLET | Freq: Three times a day (TID) | ORAL | Status: DC | PRN

## 2015-12-06 MED ORDER — ESCITALOPRAM OXALATE 10 MG PO TABS: 40.0000 mg | ORAL_TABLET | Freq: Every day | ORAL | Status: DC

## 2015-12-06 MED ORDER — ZONISAMIDE 100 MG PO CAPS
200.0000 mg | ORAL_CAPSULE | Freq: Every day | ORAL | Status: DC
Start: 2015-12-06 — End: 2015-12-06
  Filled 2015-12-06: qty 2

## 2015-12-06 MED ORDER — ALUM & MAG HYDROXIDE-SIMETH 200-200-20 MG/5ML PO SUSP: 30.0000 mL | ORAL | Status: DC | PRN
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
## 2015-12-06 NOTE — ED Notes (Signed)
TTS at bedside. 

## 2015-12-06 NOTE — BH Assessment (Signed)
Assessment completed. Consulted with Maryjean Morn, PA-C who states that patient does not meet inpatient criteria and recommends patient follow up with psychiatry.   Davina Poke, LCSW Therapeutic Triage Specialist [facility] 12/06/2015 4:53 AM
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
## 2016-01-28 ENCOUNTER — Emergency Department (HOSPITAL_COMMUNITY)
Admission: EM | Admit: 2016-01-28 | Discharge: 2016-01-29 | Disposition: A | Discharge status: Home / Self Care | Payer: Medicaid Other | Source: Home / Self Care

## 2016-01-28 ENCOUNTER — Encounter (HOSPITAL_COMMUNITY): Payer: Self-pay | Admitting: Emergency Medicine

## 2016-01-28 DIAGNOSIS — R600 Localized edema: Secondary | ICD-10-CM

## 2016-01-28 DIAGNOSIS — R945 Abnormal results of liver function studies: Secondary | ICD-10-CM | POA: Diagnosis not present

## 2016-01-28 DIAGNOSIS — Z7982 Long term (current) use of aspirin: Secondary | ICD-10-CM | POA: Diagnosis not present

## 2016-01-28 DIAGNOSIS — I10 Essential (primary) hypertension: Secondary | ICD-10-CM | POA: Insufficient documentation

## 2016-01-28 DIAGNOSIS — Z79899 Other long term (current) drug therapy: Secondary | ICD-10-CM | POA: Diagnosis not present

## 2016-01-28 DIAGNOSIS — M7989 Other specified soft tissue disorders: Secondary | ICD-10-CM | POA: Diagnosis present

## 2016-01-28 DIAGNOSIS — Z5321 Procedure and treatment not carried out due to patient leaving prior to being seen by health care provider: Secondary | ICD-10-CM

## 2016-01-28 DIAGNOSIS — D649 Anemia, unspecified: Secondary | ICD-10-CM | POA: Diagnosis not present

## 2016-01-28 NOTE — ED Notes (Signed)
Pt called for vital signs; no answer.

## 2016-01-28 NOTE — ED Notes (Signed)
Pt called for vital signs, no answer x2.  

## 2016-01-28 NOTE — ED Notes (Signed)
Pt's name called for vitals no answer 

## 2016-01-28 NOTE — ED Triage Notes (Signed)
Pt here with bilateral leg swelling x 4 days with pain. Pt reports that she has had this in the past when she was placed on Keppra. Pt sts no longer taking keppra- now taking depakote for 3 weeks and believes it to be related to medication. Pt also has redness to legs.

## 2016-01-29 ENCOUNTER — Emergency Department (HOSPITAL_COMMUNITY)
Admission: EM | Admit: 2016-01-29 | Discharge: 2016-01-29 | Disposition: A | Discharge status: Home / Self Care | Payer: Medicaid Other | Attending: Emergency Medicine | Admitting: Emergency Medicine

## 2016-01-29 ENCOUNTER — Encounter (HOSPITAL_COMMUNITY): Payer: Self-pay | Admitting: Emergency Medicine

## 2016-01-29 DIAGNOSIS — Z7982 Long term (current) use of aspirin: Secondary | ICD-10-CM | POA: Insufficient documentation

## 2016-01-29 DIAGNOSIS — Z79899 Other long term (current) drug therapy: Secondary | ICD-10-CM | POA: Insufficient documentation

## 2016-01-29 DIAGNOSIS — I10 Essential (primary) hypertension: Secondary | ICD-10-CM | POA: Insufficient documentation

## 2016-01-29 DIAGNOSIS — D649 Anemia, unspecified: Secondary | ICD-10-CM

## 2016-01-29 DIAGNOSIS — R7989 Other specified abnormal findings of blood chemistry: Secondary | ICD-10-CM

## 2016-01-29 DIAGNOSIS — R600 Localized edema: Secondary | ICD-10-CM | POA: Insufficient documentation

## 2016-01-29 DIAGNOSIS — R945 Abnormal results of liver function studies: Secondary | ICD-10-CM | POA: Insufficient documentation

## 2016-01-29 DIAGNOSIS — R609 Edema, unspecified: Secondary | ICD-10-CM

## 2016-01-29 LAB — COMPREHENSIVE METABOLIC PANEL WITH GFR
ALT: 57 U/L — ABNORMAL HIGH (ref 14–54)
AST: 54 U/L — ABNORMAL HIGH (ref 15–41)
Albumin: 3.3 g/dL — ABNORMAL LOW (ref 3.5–5.0)
Alkaline Phosphatase: 49 U/L (ref 38–126)
Anion gap: 8 (ref 5–15)
BUN: 8 mg/dL (ref 6–20)
CO2: 24 mmol/L (ref 22–32)
Calcium: 8.9 mg/dL (ref 8.9–10.3)
Chloride: 102 mmol/L (ref 101–111)
Creatinine, Ser: 0.65 mg/dL (ref 0.44–1.00)
GFR calc Af Amer: >60 mL/min
GFR calc non Af Amer: >60 mL/min
Glucose, Bld: 84 mg/dL (ref 65–99)
Potassium: 3.8 mmol/L (ref 3.5–5.1)
Sodium: 134 mmol/L — ABNORMAL LOW (ref 135–145)
Total Bilirubin: 0.2 mg/dL — ABNORMAL LOW (ref 0.3–1.2)
Total Protein: 5.9 g/dL — ABNORMAL LOW (ref 6.5–8.1)

## 2016-01-29 LAB — CBC WITH DIFFERENTIAL/PLATELET
BASOS ABS: 0.1 10*3/uL (ref 0.0–0.1)
BASOS PCT: 1 %
EOS ABS: 0.3 10*3/uL (ref 0.0–0.7)
EOS PCT: 4 %
HCT: 33.7 % — ABNORMAL LOW (ref 36.0–46.0)
Hemoglobin: 10.7 g/dL — ABNORMAL LOW (ref 12.0–15.0)
LYMPHS PCT: 37 %
Lymphs Abs: 2.9 10*3/uL (ref 0.7–4.0)
MCH: 28.5 pg (ref 26.0–34.0)
MCHC: 31.8 g/dL (ref 30.0–36.0)
MCV: 89.6 fL (ref 78.0–100.0)
MONO ABS: 1 10*3/uL (ref 0.1–1.0)
Monocytes Relative: 13 %
Neutro Abs: 3.5 10*3/uL (ref 1.7–7.7)
Neutrophils Relative %: 45 %
PLATELETS: 289 10*3/uL (ref 150–400)
RBC: 3.76 MIL/uL — AB (ref 3.87–5.11)
RDW: 13.1 % (ref 11.5–15.5)
WBC: 7.8 10*3/uL (ref 4.0–10.5)

## 2016-01-29 LAB — BRAIN NATRIURETIC PEPTIDE: B Natriuretic Peptide: 77.1 pg/mL (ref 0.0–100.0)

## 2016-01-29 LAB — I-STAT BETA HCG BLOOD, ED (MC, WL, AP ONLY)

## 2016-01-29 MED ORDER — FUROSEMIDE 20 MG PO TABS: 20.0000 mg | ORAL_TABLET | Freq: Every day | ORAL | 0 refills | Status: DC

## 2016-01-29 NOTE — ED Triage Notes (Signed)
Pt her with bilateral leg pain and swelling and some swelling in abd area; unsure of cause

## 2016-01-29 NOTE — Discharge Instructions (Signed)
Elevate your legs at home. Try compression stockings. Take Lasix as prescribed to help with swelling. Follow-up with family doctor.

## 2016-01-29 NOTE — ED Provider Notes (Signed)
[facility]-EMERGENCY DEPT Provider Note   CSN: [id number] Arrival date & time: 01/29/16  0736     History   Chief Complaint Chief Complaint  Patient presents with  . Leg Swelling    HPI Kamarie Veno is a 30 y.o. female.  HPI Whitnie Deleon is a 30 y.o. female with history of hypertension, seizures, gestational diabetes, depression, presents emergency department complaining of bilateral leg swelling. Patient reports onset of both leg swelling 4 days ago. She reports some pain and tightness sensation in lower legs. She denies any injuries. She denies any recent travel or surgeries. She denies any calf pain. She reports similar history of the same after she was started on Keppra for seizures. She denies being pregnant. No history of blood clotting disorder. Has tried to elevate her legs with no relief of swelling. States in the past when her leg swelled up she received "fluid pill"  which has helped. She reports not taking Keppra at this time, but reports taking Depakote which she was started on just 2 weeks ago.  Past Medical History:  Diagnosis Date  . Anxiety   . Depression   . Gestational diabetes   . Hypertension    Dx 22  . Mental disorder   . Neuromuscular disorder (HCC)   . Seizures [facility])     Patient Active Problem List   Diagnosis Date Noted  . 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 Anxiety 09/01/2013  . Dehydration 08/31/2013  . S/P emergency cesarean section 12/01/2012    Past Surgical History:  Procedure Laterality Date  . CESAREAN SECTION N/A 12/01/2012   Procedure: Primary Cesarean Section Delivery Baby Boy  @ [phone],    Apgars 9/9;  Surgeon: Tilda Burrow, MD;  Location: [facility] ORS;  Service: Obstetrics;  Laterality: N/A;    OB History    Gravida Para Term Preterm AB Living   1 1 0 1 0 0   SAB TAB Ectopic Multiple Live Births   0 0 0 0         Home Medications    Prior to Admission medications   Medication Sig Start Date End Date Taking? Authorizing  Provider  ALPRAZolam Prudy Feeler) 1 MG tablet Take 1 mg by mouth 3 (three) times daily.    Yes Historical Provider, MD  aspirin-acetaminophen-caffeine (EXCEDRIN MIGRAINE) [id number] MG tablet Take 2 tablets by mouth 2 (two) times daily as needed for headache.   Yes Historical Provider, MD  butalbital-acetaminophen-caffeine (FIORICET, ESGIC) 50-325-40 MG per tablet Take 1 tablet by mouth 3 (three) times daily as needed for headache (headache).    Yes Historical Provider, MD  divalproex (DEPAKOTE ER) 250 MG 24 hr tablet Take 250 mg by mouth daily after supper.   Yes Historical Provider, MD  escitalopram (LEXAPRO) 20 MG tablet Take 40 mg by mouth daily.   Yes Historical Provider, MD  ibuprofen (ADVIL,MOTRIN) 400 MG tablet Take 1 tablet (400 mg total) by mouth 3 (three) times daily. Patient taking differently: Take 600 mg by mouth daily as needed (cramping and leg pain).  11/02/15  Yes Azalia Bilis, MD  naproxen (NAPROSYN) 500 MG tablet Take 500 mg by mouth 2 (two) times daily as needed (Cramp).  01/13/16  Yes Historical Provider, MD  ondansetron (ZOFRAN) 4 MG tablet Take 1 tablet (4 mg total) by mouth every 6 (six) hours. Patient taking differently: Take 4 mg by mouth every 6 (six) hours as needed for nausea or vomiting.  07/29/15  Yes Stevi Barrett, PA-C  propranolol (INDERAL)  40 MG tablet Take 40 mg by mouth 2 (two) times daily.   Yes Historical Provider, MD  methocarbamol (ROBAXIN) 500 MG tablet Take 1 tablet (500 mg total) by mouth 2 (two) times daily. Patient not taking: Reported on 01/29/2016 08/26/15   Roxy Horseman, PA-C    Family History History reviewed. No pertinent family history.  Social History Social History  Substance Use Topics  . Smoking status: Never Smoker  . Smokeless tobacco: Never Used  . Alcohol use No     Allergies   Haldol [haloperidol] and Imitrex [sumatriptan]   Review of Systems Review of Systems  Constitutional: Negative for chills and fever.  Respiratory:  Negative for cough, chest tightness and shortness of breath.   Cardiovascular: Positive for leg swelling. Negative for chest pain and palpitations.  Gastrointestinal: Negative for abdominal pain, diarrhea, nausea and vomiting.  Genitourinary: Negative for dysuria, flank pain and pelvic pain.  Musculoskeletal: Positive for myalgias. Negative for neck pain and neck stiffness.  Skin: Negative for rash.  Neurological: Negative for dizziness, weakness and headaches.  All other systems reviewed and are negative.    Physical Exam Updated Vital Signs BP 103/66   Pulse 81   Temp 98.8 F (37.1 C) (Oral)   Resp 18   LMP 12/26/2015 (Approximate)   SpO2 99%   Physical Exam  Constitutional: She appears well-developed and well-nourished. No distress.  HENT:  Head: Normocephalic.  Eyes: Conjunctivae are normal.  Neck: Neck supple.  Cardiovascular: Normal rate, regular rhythm and normal heart sounds.   Pulmonary/Chest: Effort normal and breath sounds normal. No respiratory distress. She has no wheezes. She has no rales.  Abdominal: Soft. Bowel sounds are normal. She exhibits no distension. There is no tenderness. There is no rebound.  Musculoskeletal: She exhibits no edema.  Bilateral lower extremity edema, from just below the knees, through ankles and feet. Edema is nonpitting. Patient with multiple abrasions to bilateral legs, states those are not new. No calf tenderness. Dorsal pedal pulses intact and equal bilaterally. No significant erythema, not warm to the touch. Negative Homans sign bilaterally.  Neurological: She is alert.  Skin: Skin is warm and dry.  Psychiatric: She has a normal mood and affect. Her behavior is normal.  Nursing note and vitals reviewed.    ED Treatments / Results  Labs (all labs ordered are listed, but only abnormal results are displayed) Labs Reviewed  CBC WITH DIFFERENTIAL/PLATELET - Abnormal; Notable for the following:       Result Value   RBC 3.76 (*)     Hemoglobin 10.7 (*)    HCT 33.7 (*)    All other components within normal limits  COMPREHENSIVE METABOLIC PANEL - Abnormal; Notable for the following:    Sodium 134 (*)    Total Protein 5.9 (*)    Albumin 3.3 (*)    AST 54 (*)    ALT 57 (*)    Total Bilirubin 0.2 (*)    All other components within normal limits  BRAIN NATRIURETIC PEPTIDE  I-STAT BETA HCG BLOOD, ED ([facility], [facility], [facility] ONLY)    EKG  EKG Interpretation None       Radiology No results found.  Procedures Procedures (including critical care time)  Medications Ordered in ED Medications - No data to display   Initial Impression / Assessment and Plan / ED Course  I have reviewed the triage vital signs and the nursing notes.  Pertinent labs & imaging results that were available during my care of the  patient were reviewed by me and considered in my medical decision making (see chart for details).  Clinical Course   Patient with bilateral, symmetric lower extremity edema, nonpitting. Reports history of the same. Believe this could be related to Depakote that she started 2 weeks ago. I did look up side effects of Depakote which does include swelling of extremities. Patient has normal renal function. She does have slightly decreased albumin and protein levels. She is slightly anemic. Mild elevation in LFTs, otherwise unremarkable labs. She denies any chest pain or shortness of breath. I do not think patient has DVT. I discussed with Dr. Juleen ChinaKohut, will give Lasix for a few days, will have her follow up with primary care doctor. Discussed findings and plan with patient, who agrees.  Vitals:   01/29/16 0741 01/29/16 0815 01/29/16 0900  BP: 122/84 102/63 103/66  Pulse: 91 91 81  Resp: 18  18  Temp: 98.8 F (37.1 C)    TempSrc: Oral    SpO2: 100% 99% 99%     Final Clinical Impressions(s) / ED Diagnoses   Final diagnoses:  Peripheral edema  Elevated LFTs  Anemia, unspecified anemia type    New Prescriptions New  Prescriptions   FUROSEMIDE (LASIX) 20 MG TABLET    Take 1 tablet (20 mg total) by mouth daily.     Jaynie Crumbleatyana Jood Retana, PA-C 01/29/16 [id number]    Raeford RazorStephen Kohut, MD 01/29/16 [phone]

## 2016-05-13 ENCOUNTER — Emergency Department (HOSPITAL_COMMUNITY)
Admission: EM | Admit: 2016-05-13 | Discharge: 2016-05-14 | Disposition: A | Discharge status: Home / Self Care | Payer: Medicaid Other | Attending: Emergency Medicine | Admitting: Emergency Medicine

## 2016-05-13 ENCOUNTER — Encounter (HOSPITAL_COMMUNITY): Payer: Self-pay | Admitting: Nurse Practitioner

## 2016-05-13 DIAGNOSIS — Z7982 Long term (current) use of aspirin: Secondary | ICD-10-CM | POA: Diagnosis not present

## 2016-05-13 DIAGNOSIS — F419 Anxiety disorder, unspecified: Secondary | ICD-10-CM | POA: Diagnosis present

## 2016-05-13 DIAGNOSIS — Z79899 Other long term (current) drug therapy: Secondary | ICD-10-CM | POA: Insufficient documentation

## 2016-05-13 DIAGNOSIS — I10 Essential (primary) hypertension: Secondary | ICD-10-CM | POA: Insufficient documentation

## 2016-05-13 DIAGNOSIS — F1910 Other psychoactive substance abuse, uncomplicated: Secondary | ICD-10-CM

## 2016-05-13 DIAGNOSIS — F13180 Sedative, hypnotic or anxiolytic abuse with sedative, hypnotic or anxiolytic-induced anxiety disorder: Secondary | ICD-10-CM | POA: Diagnosis not present

## 2016-05-13 LAB — COMPREHENSIVE METABOLIC PANEL
ALBUMIN: 4.2 g/dL (ref 3.5–5.0)
ALT: 12 U/L — ABNORMAL LOW (ref 14–54)
ANION GAP: 9 (ref 5–15)
AST: 23 U/L (ref 15–41)
Alkaline Phosphatase: 50 U/L (ref 38–126)
BUN: 9 mg/dL (ref 6–20)
CO2: 23 mmol/L (ref 22–32)
Calcium: 9.9 mg/dL (ref 8.9–10.3)
Chloride: 107 mmol/L (ref 101–111)
Creatinine, Ser: 0.7 mg/dL (ref 0.44–1.00)
GFR calc non Af Amer: >60 mL/min (ref >60)
GLUCOSE: 103 mg/dL — AB (ref 65–99)
POTASSIUM: 3.4 mmol/L — AB (ref 3.5–5.1)
SODIUM: 139 mmol/L (ref 135–145)
Total Bilirubin: 0.3 mg/dL (ref 0.3–1.2)
Total Protein: 7.5 g/dL (ref 6.5–8.1)

## 2016-05-13 LAB — RAPID URINE DRUG SCREEN, HOSP PERFORMED
AMPHETAMINES: NOT DETECTED
BENZODIAZEPINES: POSITIVE — AB
Barbiturates: POSITIVE — AB
Cocaine: NOT DETECTED
OPIATES: NOT DETECTED
TETRAHYDROCANNABINOL: NOT DETECTED

## 2016-05-13 LAB — CBC
HCT: 39.5 % (ref 36.0–46.0)
HEMOGLOBIN: 13.5 g/dL (ref 12.0–15.0)
MCH: 27.8 pg (ref 26.0–34.0)
MCHC: 34.2 g/dL (ref 30.0–36.0)
MCV: 81.4 fL (ref 78.0–100.0)
Platelets: 305 10*3/uL (ref 150–400)
RBC: 4.85 MIL/uL (ref 3.87–5.11)
RDW: 13.3 % (ref 11.5–15.5)
WBC: 7.2 10*3/uL (ref 4.0–10.5)

## 2016-05-13 LAB — ETHANOL: Alcohol, Ethyl (B): <5 mg/dL (ref <5)

## 2016-05-13 NOTE — ED Triage Notes (Signed)
Pt presents with c/o anxiety. 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 She went to [facility] this week requesting detox from the xanax and they prescribed her klonopin. She is afraid to be at home alone on the klonopin. She fears she may have a seizure when she is alone. She is requesting to be admitted for supervision during her detox from the xanax.

## 2016-05-13 NOTE — ED Provider Notes (Signed)
[facility]-EMERGENCY DEPT Provider Note   CSN: [id number] Arrival date & time: 05/13/16  1726  By signing my name below, I, Alyssa Grove, attest that this documentation has been prepared under the direction and in the presence of Jerelyn Scott, MD. Electronically Signed: Alyssa Grove, ED Scribe. 05/14/16. 12:05 AM.   History   Chief Complaint Chief Complaint  Patient presents with  . Anxiety   Pt reports several month hx of seizures due to Xanax abuse. Her last seizure was a few days before Christmas. She states one of her past seizures require intubation. Pt was seen at the [facility] for detox from Xanax and was prescribed Klonopin. She took her first Klonopin and stopped taking Xanax on 05/11/2016. Pt reports associated nausea and headache. She has tried Motrin and Excedrin with no relief to symptoms. Pt is unsure of effects of Klonopin and does not want to be at home by herself during detox.    The history is provided by the patient. No language interpreter was used.  Headache   This is a new problem. The current episode started 12 to 24 hours ago. The problem occurs constantly. The problem has not changed since onset.Associated with: new medication / medication withdrawal  The pain is moderate. The pain does not radiate. Associated symptoms include nausea. She has tried NSAIDs for the symptoms. The treatment provided no relief.    Past Medical History:  Diagnosis Date  . Anxiety   . Depression   . Gestational diabetes   . Hypertension    Dx 22  . Mental disorder   . Neuromuscular disorder (HCC)   . Seizures [facility])     Patient Active Problem List   Diagnosis Date Noted  . 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 Anxiety 09/01/2013  . Dehydration 08/31/2013  . S/P emergency cesarean section 12/01/2012    Past Surgical History:  Procedure Laterality Date  . CESAREAN SECTION N/A 12/01/2012   Procedure: Primary Cesarean Section Delivery Baby Boy  @ [phone],    Apgars 9/9;  Surgeon: Tilda Burrow, MD;  Location: [facility] ORS;  Service: Obstetrics;  Laterality: N/A;    OB History    Gravida Para Term Preterm AB Living   1 1 0 1 0 0   SAB TAB Ectopic Multiple Live Births   0 0 0 0         Home Medications    Prior to Admission medications   Medication Sig Start Date End Date Taking? Authorizing Provider  ALPRAZolam Prudy Feeler) 1 MG tablet Take 1 mg by mouth 3 (three) times daily.     Historical Provider, MD  aspirin-acetaminophen-caffeine (EXCEDRIN MIGRAINE) [phone] MG tablet Take 2 tablets by mouth 2 (two) times daily as needed for headache.    Historical Provider, MD  butalbital-acetaminophen-caffeine (FIORICET, ESGIC) 50-325-40 MG per tablet Take 1 tablet by mouth 3 (three) times daily as needed for headache (headache).     Historical Provider, MD  divalproex (DEPAKOTE ER) 250 MG 24 hr tablet Take 250 mg by mouth daily after supper.    Historical Provider, MD  escitalopram (LEXAPRO) 20 MG tablet Take 40 mg by mouth daily.    Historical Provider, MD  furosemide (LASIX) 20 MG tablet Take 1 tablet (20 mg total) by mouth daily. 01/29/16   Tatyana Kirichenko, PA-C  ibuprofen (ADVIL,MOTRIN) 400 MG tablet Take 1 tablet (400 mg total) by mouth 3 (three) times daily. Patient taking differently: Take 600 mg by mouth daily as needed (cramping and leg  pain).  11/02/15   Azalia BilisKevin Campos, MD  methocarbamol (ROBAXIN) 500 MG tablet Take 1 tablet (500 mg total) by mouth 2 (two) times daily. Patient not taking: Reported on 01/29/2016 08/26/15   Roxy Horsemanobert Browning, PA-C  naproxen (NAPROSYN) 500 MG tablet Take 500 mg by mouth 2 (two) times daily as needed (Cramp).  01/13/16   Historical Provider, MD  ondansetron (ZOFRAN) 4 MG tablet Take 1 tablet (4 mg total) by mouth every 6 (six) hours. Patient taking differently: Take 4 mg by mouth every 6 (six) hours as needed for nausea or vomiting.  07/29/15   Rolm GalaStevi Barrett, PA-C  propranolol (INDERAL) 40 MG tablet Take 40 mg by mouth 2 (two) times daily.     Historical Provider, MD    Family History History reviewed. No pertinent family history.  Social History Social History  Substance Use Topics  . Smoking status: Never Smoker  . Smokeless tobacco: Never Used  . Alcohol use No     Allergies   Haldol [haloperidol] and Imitrex [sumatriptan]   Review of Systems Review of Systems  Gastrointestinal: Positive for nausea.  Neurological: Positive for seizures and headaches.  Psychiatric/Behavioral: The patient is nervous/anxious.   All other systems reviewed and are negative.    Physical Exam Updated Vital Signs BP 130/87 (BP Location: Left Arm)   Pulse 89   Temp 98.6 F (37 C) (Oral)   Resp 16   SpO2 100%  Vitals reviewed Physical Exam Physical Examination: General appearance - alert, well appearing, and in no distress Mental status - alert, oriented to person, place, and time Eyes - no conjunctival injection, no scleral icterus Mouth - mucous membranes moist, pharynx normal without lesions Chest - clear to auscultation, no wheezes, rales or rhonchi, symmetric air entry Heart - normal rate, regular rhythm, normal S1, S2, no murmurs, rubs, clicks or gallops Abdomen - soft, nontender, nondistended, no masses or organomegaly Neurological - alert, oriented x 3, normal speech, no facial droop, moving all extremities Extremities - peripheral pulses normal, no pedal edema, no clubbing or cyanosis Skin - normal coloration and turgor, no rashes Psych- flat affect, calm and cooperative  ED Treatments / Results  DIAGNOSTIC STUDIES: Oxygen Saturation is 100% on RA, normal by my interpretation.    COORDINATION OF CARE: 11:57 PM Discussed treatment plan with pt at bedside which includes consult with TTS and pt agreed to plan.  Labs (all labs ordered are listed, but only abnormal results are displayed) Labs Reviewed  COMPREHENSIVE METABOLIC PANEL - Abnormal; Notable for the following:       Result Value   Potassium 3.4 (*)     Glucose, Bld 103 (*)    ALT 12 (*)    All other components within normal limits  RAPID URINE DRUG SCREEN, HOSP PERFORMED - Abnormal; Notable for the following:    Benzodiazepines POSITIVE (*)    Barbiturates POSITIVE (*)    All other components within normal limits  ETHANOL  CBC    EKG  EKG Interpretation None       Radiology No results found.  Procedures Procedures (including critical care time)  Medications Ordered in ED Medications - No data to display   Initial Impression / Assessment and Plan / ED Course  I have reviewed the triage vital signs and the nursing notes.  Pertinent labs & imaging results that were available during my care of the patient were reviewed by me and considered in my medical decision making (see chart for details).  Clinical  Course   12:57 AM TTS has seen patient, they states patient does not meet inpatient criteria for detox- they are providing outpatient resources for her to follow with prior to returning to the [facility] so that they can supervise her detox.   Headache is most c/w tension type headache- no signs of SAH, no abnormality in neuro exam.     Final Clinical Impressions(s) / ED Diagnoses   Final diagnoses:  Substance abuse  Anxiety    New Prescriptions Discharge Medication List as of 05/14/2016  1:01 AM     I personally performed the services described in this documentation, which was scribed in my presence. The recorded information has been reviewed and is accurate.     Jerelyn Scott, MD 05/15/16 [phone]

## 2016-05-13 NOTE — ED Notes (Signed)
Pt reports last dose of xanax was 2 days ago and last seizure-like activity was 2 weeks ago.

## 2016-05-14 MED ORDER — ONDANSETRON HCL 4 MG PO TABS: 4.0000 mg | ORAL_TABLET | Freq: Three times a day (TID) | ORAL | Status: DC | PRN

## 2016-05-14 MED ORDER — ACETAMINOPHEN 325 MG PO TABS: 650.0000 mg | ORAL_TABLET | ORAL | Status: DC | PRN

## 2016-05-14 MED ORDER — ZOLPIDEM TARTRATE 5 MG PO TABS: 5.0000 mg | ORAL_TABLET | Freq: Every evening | ORAL | Status: DC | PRN

## 2016-05-14 MED ORDER — IBUPROFEN 400 MG PO TABS: 600.0000 mg | ORAL_TABLET | Freq: Three times a day (TID) | ORAL | Status: DC | PRN

## 2016-05-14 NOTE — Discharge Instructions (Signed)
Return to the ED with any concerns including thoughts or feelings of suicide, recurrent seizure activity, or any other alarming symptoms  You should be sure to followup with the outpatient resources provided for detox management
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
## 2016-05-14 NOTE — BH Assessment (Signed)
Per Nira ConnJason Berry, FNP pt does not meet criteria for inpt treatment and has been recommended to receive OPT resources for detox services. Spoke with Jake Batheallie S Straughan, RN and Linker, MD who are in agreement with the pt receiving OPT detox resources. Information was faxed to the provided fax number.  Princess BruinsAquicha Duff, MSW, Theresia MajorsLCSWA

## 2016-05-14 NOTE — ED Notes (Signed)
[facility] Pt verbalized understanding of discharge instructions and follow-up care.

## 2016-05-14 NOTE — BH Assessment (Signed)
Spoke with Geraldine Contrasee, RN who reports the pt needs to be transferred to a room and requested a call back in 5 minutes in order to set up the TTS to complete the assessment.  Princess BruinsAquicha Duff, MSW, Theresia MajorsLCSWA

## 2017-02-16 ENCOUNTER — Emergency Department (HOSPITAL_COMMUNITY)
Admission: EM | Admit: 2017-02-16 | Discharge: 2017-02-17 | Disposition: A | Discharge status: Home / Self Care | Payer: Medicaid Other | Attending: Emergency Medicine | Admitting: Emergency Medicine

## 2017-02-16 ENCOUNTER — Emergency Department (HOSPITAL_COMMUNITY): Payer: Medicaid Other

## 2017-02-16 ENCOUNTER — Encounter (HOSPITAL_COMMUNITY): Payer: Self-pay

## 2017-02-16 ENCOUNTER — Other Ambulatory Visit: Payer: Self-pay

## 2017-02-16 DIAGNOSIS — R1084 Generalized abdominal pain: Secondary | ICD-10-CM

## 2017-02-16 DIAGNOSIS — I10 Essential (primary) hypertension: Secondary | ICD-10-CM | POA: Diagnosis not present

## 2017-02-16 DIAGNOSIS — B9689 Other specified bacterial agents as the cause of diseases classified elsewhere: Secondary | ICD-10-CM | POA: Insufficient documentation

## 2017-02-16 DIAGNOSIS — R102 Pelvic and perineal pain: Secondary | ICD-10-CM | POA: Diagnosis not present

## 2017-02-16 DIAGNOSIS — R112 Nausea with vomiting, unspecified: Secondary | ICD-10-CM | POA: Diagnosis not present

## 2017-02-16 DIAGNOSIS — R55 Syncope and collapse: Secondary | ICD-10-CM | POA: Diagnosis present

## 2017-02-16 DIAGNOSIS — N760 Acute vaginitis: Secondary | ICD-10-CM | POA: Insufficient documentation

## 2017-02-16 DIAGNOSIS — Z79899 Other long term (current) drug therapy: Secondary | ICD-10-CM | POA: Insufficient documentation

## 2017-02-16 DIAGNOSIS — R569 Unspecified convulsions: Secondary | ICD-10-CM | POA: Insufficient documentation

## 2017-02-16 LAB — COMPREHENSIVE METABOLIC PANEL
ALK PHOS: 53 U/L (ref 38–126)
ALT: 16 U/L (ref 14–54)
ANION GAP: 7 (ref 5–15)
AST: 18 U/L (ref 15–41)
Albumin: 3.9 g/dL (ref 3.5–5.0)
BILIRUBIN TOTAL: 0.5 mg/dL (ref 0.3–1.2)
BUN: 8 mg/dL (ref 6–20)
CALCIUM: 8.9 mg/dL (ref 8.9–10.3)
CO2: 22 mmol/L (ref 22–32)
Chloride: 107 mmol/L (ref 101–111)
Creatinine, Ser: 0.69 mg/dL (ref 0.44–1.00)
GFR calc Af Amer: >60 mL/min (ref >60)
GFR calc non Af Amer: >60 mL/min (ref >60)
Glucose, Bld: 96 mg/dL (ref 65–99)
Potassium: 3.6 mmol/L (ref 3.5–5.1)
SODIUM: 136 mmol/L (ref 135–145)
TOTAL PROTEIN: 6.6 g/dL (ref 6.5–8.1)

## 2017-02-16 LAB — CBG MONITORING, ED: GLUCOSE-CAPILLARY: 93 mg/dL (ref 65–99)

## 2017-02-16 LAB — I-STAT BETA HCG BLOOD, ED (MC, WL, AP ONLY): I-stat hCG, quantitative: <5 m[IU]/mL (ref <5)

## 2017-02-16 LAB — CBC
HCT: 35.5 % — ABNORMAL LOW (ref 36.0–46.0)
HEMOGLOBIN: 11.8 g/dL — AB (ref 12.0–15.0)
MCH: 28.2 pg (ref 26.0–34.0)
MCHC: 33.2 g/dL (ref 30.0–36.0)
MCV: 84.9 fL (ref 78.0–100.0)
Platelets: 255 10*3/uL (ref 150–400)
RBC: 4.18 MIL/uL (ref 3.87–5.11)
RDW: 12.9 % (ref 11.5–15.5)
WBC: 4.9 10*3/uL (ref 4.0–10.5)

## 2017-02-16 LAB — LIPASE, BLOOD: Lipase: 21 U/L (ref 11–51)

## 2017-02-16 MED ORDER — IOPAMIDOL (ISOVUE-300) INJECTION 61%
INTRAVENOUS | Status: AC
  Administered 2017-02-17: 100 mL
  Filled 2017-02-16: qty 100

## 2017-02-16 MED ORDER — ONDANSETRON 4 MG PO TBDP
4.0000 mg | ORAL_TABLET | Freq: Once | ORAL | Status: AC
  Administered 2017-02-16: 4 mg via ORAL
  Filled 2017-02-16: qty 1

## 2017-02-16 MED ORDER — BUTALBITAL-APAP-CAFFEINE 50-325-40 MG PO TABS
1.0000 | ORAL_TABLET | Freq: Once | ORAL | Status: AC
  Administered 2017-02-16: 1 via ORAL
  Filled 2017-02-16: qty 1

## 2017-02-16 MED ORDER — ONDANSETRON HCL 4 MG/2ML IJ SOLN
4.0000 mg | Freq: Once | INTRAMUSCULAR | Status: DC
Start: 2017-02-16 — End: 2017-02-16

## 2017-02-16 NOTE — ED Triage Notes (Signed)
Pt reports she has had abdominal pain, vomiting, dizziness X3 days. She had a syncopal episode at home and went to [facility], was sent here for follow up. Pt AOx4. No distress noted.

## 2017-02-16 NOTE — ED Provider Notes (Signed)
[facility]-EMERGENCY DEPT Provider Note   CSN: [id number] Arrival date & time: 02/16/17  1616     History   Chief Complaint Chief Complaint  Patient presents with  . Loss of Consciousness    HPI Elisheba Mcdonnell is a 31 y.o. female.  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  Patient was seen earlier tonight at [facility] urgent care.  She states she had a syncopal episode this evening while walking around the house.  She has had multiple stressors including raising her autistic child as a single mother.  She reportedly had a syncopal episode 10 days ago and was seen at [facility].  She was told she possibly had a seizure versus syncopal episode.  No family history of syncope or early cardiac deaths.  At urgent care today, she was noted to be generally tender on her abdominal exam and sent for further evaluation.  Patient has had prior C-section without additional abdominal surgeries.  No recent medication changes.  Denies fevers.  Poor p.o. intake for the past several days with intermittent vomiting.  The history is provided by the patient and medical records. No language interpreter was used.    Past Medical History:  Diagnosis Date  . Anxiety   . Depression   . Gestational diabetes   . Hypertension    Dx 21  . Mental disorder   . Neuromuscular disorder (HCC)   . Seizures [facility])     Patient Active Problem List   Diagnosis Date Noted  . 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 Anxiety 09/01/2013  . Dehydration 08/31/2013  . S/P emergency cesarean section 12/01/2012    Past Surgical History:  Procedure Laterality Date  . CESAREAN SECTION N/A 12/01/2012   Procedure: Primary Cesarean Section Delivery Baby Boy  @ [phone],    [place] 9/9;  Surgeon: Tilda Burrow, MD;  Location: [facility];  Service: Obstetrics;  Laterality: N/A;    OB History    Gravida Para Term Preterm AB Living    1 1 0 1 0 0   SAB TAB Ectopic Multiple Live Births   0 0 0 0         Home Medications    Prior to Admission medications   Medication Sig Start Date End Date Taking? Authorizing Provider  aspirin-acetaminophen-caffeine (EXCEDRIN MIGRAINE) [phone] MG tablet Take 2 tablets by mouth 2 (two) times daily as needed for headache.   Yes [provider]  butalbital-acetaminophen-caffeine (FIORICET, ESGIC) 50-325-40 MG per tablet Take 1 tablet by mouth 3 (three) times daily as needed for headache (headache).    Yes [provider]  clonazePAM (KLONOPIN) 1 MG tablet Take 1 mg by mouth 2 (two) times daily.   Yes [provider]  Diclofenac Potassium 50 MG PACK Take 50 mg by mouth as needed.  11/25/16  Yes [provider]  ERENUMAB-AOOE [place] Inject 140 mg into the skin. 01/26/17  Yes [provider]  escitalopram (LEXAPRO) 20 MG tablet Take 20 mg by mouth daily.    Yes [provider]  ibuprofen (ADVIL,MOTRIN) 400 MG tablet Take 1 tablet (400 mg total) by mouth 3 (three) times daily. Patient taking differently: Take 600 mg by mouth daily as needed (cramping and leg pain).  11/02/15  Yes Azalia Bilis, MD  levETIRAcetam (KEPPRA XR) 500 MG 24 hr tablet Take 1,500 mg by mouth 3 (three) times daily. 07/13/16 07/13/17 Yes [provider]  propranolol (  INDERAL) 80 MG tablet Take 80 mg by mouth daily.    Yes [provider]  furosemide (LASIX) 20 MG tablet Take 1 tablet (20 mg total) by mouth daily. Patient not taking: Reported on 02/16/2017 01/29/16   Jaynie Crumble, PA-C  methocarbamol (ROBAXIN) 500 MG tablet Take 1 tablet (500 mg total) by mouth 2 (two) times daily. Patient not taking: Reported on 01/29/2016 08/26/15   Roxy Horseman, PA-C  ondansetron (ZOFRAN) 4 MG tablet Take 1 tablet (4 mg total) by mouth every 6 (six) hours. Patient not taking: Reported on 02/16/2017 07/29/15   Barrett, Rolm Gala, PA-C    Family History History  reviewed. No pertinent family history.  Social History Social History  Substance Use Topics  . Smoking status: Never Smoker  . Smokeless tobacco: Never Used  . Alcohol use No     Allergies   Haldol [haloperidol] and Imitrex [sumatriptan]   Review of Systems Review of Systems  Constitutional: Positive for fatigue. Negative for chills and fever.  HENT: Negative for ear pain and sore throat.   Eyes: Negative for pain and visual disturbance.  Respiratory: Negative for cough and shortness of breath.   Cardiovascular: Negative for chest pain and palpitations.  Gastrointestinal: Positive for abdominal pain and nausea. Negative for vomiting.  Genitourinary: Negative for dysuria and hematuria.  Musculoskeletal: Negative for arthralgias and back pain.  Skin: Negative for color change and rash.  Neurological: Positive for syncope.  All other systems reviewed and are negative.    Physical Exam Updated Vital Signs BP (!) 91/54   Pulse 82   Temp 98.3 F (36.8 C) (Oral)   Resp (!) 21   Ht  (1.448 m)   Wt 45.4 kg (100 lb)   LMP 01/25/2017 (Within Days)   SpO2 96%   BMI 21.64 kg/m   Physical Exam  Constitutional: She appears well-developed. No distress.  Thin female  HENT:  Head: Normocephalic and atraumatic.  Eyes: Conjunctivae are normal.  Neck: Neck supple.  Cardiovascular: Normal rate and regular rhythm.   No murmur heard. Pulmonary/Chest: Effort normal and breath sounds normal. No respiratory distress.  Abdominal: Soft. She exhibits no distension. There is tenderness (Generalized TTP). There is no guarding.  Genitourinary: Pelvic exam was performed with patient supine. There is no rash, tenderness or lesion on the right labia. There is no rash, tenderness or lesion on the left labia. Cervix exhibits no motion tenderness, no discharge and no friability. Right adnexum displays no mass, no tenderness and no fullness. Left adnexum displays no mass, no tenderness and no  fullness. No tenderness or bleeding in the vagina. No foreign body in the vagina. No signs of injury around the vagina. No vaginal discharge found.  Musculoskeletal: She exhibits no edema.  Neurological: She is alert. No cranial nerve deficit. Coordination normal.  5/5 motor strength and intact sensation in all extremities. Finger-to-nose intact bilaterally  Skin: Skin is warm and dry.  Nursing note and vitals reviewed.    ED Treatments / Results  Labs (all labs ordered are listed, but only abnormal results are displayed) Labs Reviewed  WET PREP, GENITAL - Abnormal; Notable for the following:       Result Value   Clue Cells Wet Prep HPF POC PRESENT (*)    WBC, Wet Prep HPF POC MODERATE (*)    All other components within normal limits  CBC - Abnormal; Notable for the following:    Hemoglobin 11.8 (*)    HCT 35.5 (*)  All other components within normal limits  LIPASE, BLOOD  COMPREHENSIVE METABOLIC PANEL  CBG MONITORING, ED  I-STAT BETA HCG BLOOD, ED ([facility], [facility], [facility] ONLY)  GC/CHLAMYDIA PROBE AMP ([place]) NOT AT [facility]    EKG  EKG Interpretation  Date/Time:  Thursday February 16 2017 16:26:00 EDT Ventricular Rate:  81 PR Interval:  130 QRS Duration: 78 QT Interval:  364 QTC Calculation: 422 R Axis:   35 Text Interpretation:  Normal sinus rhythm Cannot rule out Inferior infarct , age undetermined Abnormal ECG No significant change since last tracing Confirmed by Richardean Canal [phone]) on 02/16/2017 8:11:32 PM       Radiology Ct Abdomen Pelvis W Contrast  Result Date: 02/17/2017 CLINICAL DATA:  Acute onset of generalized abdominal pain, vomiting and dizziness. Syncope. Initial encounter. EXAM: CT ABDOMEN AND PELVIS WITH CONTRAST TECHNIQUE: Multidetector CT imaging of the abdomen and pelvis was performed using the standard protocol following bolus administration of intravenous contrast. CONTRAST:  ISOVUE-300 IOPAMIDOL (ISOVUE-300) INJECTION 61% COMPARISON:  None.  FINDINGS: Lower chest: The visualized lung bases are grossly clear. The visualized portions of the mediastinum are unremarkable. Hepatobiliary: The liver is unremarkable in appearance. The gallbladder is unremarkable in appearance. The common bile duct remains normal in caliber. Pancreas: The pancreas is within normal limits. Spleen: The spleen is unremarkable in appearance. Adrenals/Urinary Tract: The adrenal glands are unremarkable in appearance. The kidneys are within normal limits. There is no evidence of hydronephrosis. No renal or ureteral stones are identified. No perinephric stranding is seen. Stomach/Bowel: The stomach is unremarkable in appearance. The small bowel is within normal limits. The appendix is not visualized; there is no evidence for appendicitis. The colon is unremarkable in appearance. Vascular/Lymphatic: The abdominal aorta is unremarkable in appearance. The inferior vena cava is grossly unremarkable. No retroperitoneal lymphadenopathy is seen. No pelvic sidewall lymphadenopathy is identified. Reproductive: The bladder is mildly distended and within normal limits. The uterus is grossly unremarkable in appearance. The ovaries are relatively symmetric. No suspicious adnexal masses are seen. Other: No additional soft tissue abnormalities are seen. Musculoskeletal: No acute osseous abnormalities are identified. The visualized musculature is unremarkable in appearance. IMPRESSION: Unremarkable contrast-enhanced CT of the abdomen and pelvis. Electronically Signed   By: Roanna Raider M.D.   On: 02/17/2017 00:41   US Pelvic Doppler (torsion R/o Or Mass Arterial Flow)  Result Date: 02/17/2017 CLINICAL DATA:  Acute onset of pelvic pain, nausea and vomiting. Initial encounter. EXAM: TRANSABDOMINAL AND TRANSVAGINAL ULTRASOUND OF PELVIS DOPPLER ULTRASOUND OF OVARIES TECHNIQUE: Both transabdominal and transvaginal ultrasound examinations of the pelvis were performed. Transabdominal technique was  performed for global imaging of the pelvis including uterus, ovaries, adnexal regions, and pelvic cul-de-sac. It was necessary to proceed with endovaginal exam following the transabdominal exam to visualize the uterus and ovaries in greater detail. Color and duplex Doppler ultrasound was utilized to evaluate blood flow to the ovaries. COMPARISON:  None. FINDINGS: Uterus Measurements: 7.2 x 4.5 x 5.0 cm. No fibroids or other mass visualized. Endometrium Thickness: 1.3 cm.  No focal abnormality visualized. Right ovary Measurements: 2.8 x 1.2 x 1.5 cm. Normal appearance/no adnexal mass. Left ovary Measurements: 3.7 x 1.6 x 2.7 cm. Normal appearance/no adnexal mass. Pulsed Doppler evaluation of both ovaries demonstrates normal low-resistance arterial and venous waveforms. Other findings Trace free fluid is seen within the pelvic cul-de-sac. IMPRESSION: Unremarkable pelvic ultrasound.  No evidence for ovarian torsion. Electronically Signed   By: Roanna Raider M.D.   On: 02/17/2017 01:50  Dg Abdomen Acute W/chest  Result Date: 02/16/2017 CLINICAL DATA:  Acute onset of generalized abdominal pain. Syncope. Initial encounter. EXAM: DG ABDOMEN ACUTE W/ 1V CHEST COMPARISON:  Chest radiograph and CT of the abdomen and pelvis performed 01/22/2017 FINDINGS: The lungs are well-aerated and clear. There is no evidence of focal opacification, pleural effusion or pneumothorax. The cardiomediastinal silhouette is within normal limits. The visualized bowel gas pattern is unremarkable. Scattered stool and air are seen within the colon; there is no evidence of small bowel dilatation to suggest obstruction. No free intra-abdominal air is identified on the provided decubitus view. No acute osseous abnormalities are seen; the sacroiliac joints are unremarkable in appearance. IMPRESSION: 1. Unremarkable bowel gas pattern; no free intra-abdominal air seen. Small to moderate amount of stool noted in the colon. 2. No acute cardiopulmonary  process seen. Electronically Signed   By: Roanna Raider M.D.   On: 02/16/2017 22:12   US Pelvic Complete With Transvaginal  Result Date: 02/17/2017 CLINICAL DATA:  Acute onset of pelvic pain, nausea and vomiting. Initial encounter. EXAM: TRANSABDOMINAL AND TRANSVAGINAL ULTRASOUND OF PELVIS DOPPLER ULTRASOUND OF OVARIES TECHNIQUE: Both transabdominal and transvaginal ultrasound examinations of the pelvis were performed. Transabdominal technique was performed for global imaging of the pelvis including uterus, ovaries, adnexal regions, and pelvic cul-de-sac. It was necessary to proceed with endovaginal exam following the transabdominal exam to visualize the uterus and ovaries in greater detail. Color and duplex Doppler ultrasound was utilized to evaluate blood flow to the ovaries. COMPARISON:  None. FINDINGS: Uterus Measurements: 7.2 x 4.5 x 5.0 cm. No fibroids or other mass visualized. Endometrium Thickness: 1.3 cm.  No focal abnormality visualized. Right ovary Measurements: 2.8 x 1.2 x 1.5 cm. Normal appearance/no adnexal mass. Left ovary Measurements: 3.7 x 1.6 x 2.7 cm. Normal appearance/no adnexal mass. Pulsed Doppler evaluation of both ovaries demonstrates normal low-resistance arterial and venous waveforms. Other findings Trace free fluid is seen within the pelvic cul-de-sac. IMPRESSION: Unremarkable pelvic ultrasound.  No evidence for ovarian torsion. Electronically Signed   By: Roanna Raider M.D.   On: 02/17/2017 01:50    Procedures Procedures (including critical care time)  Medications Ordered in ED Medications  butalbital-acetaminophen-caffeine (FIORICET, ESGIC) 50-325-40 MG per tablet 1 tablet (1 tablet Oral Given 02/16/17 2323)  ondansetron (ZOFRAN-ODT) disintegrating tablet 4 mg (4 mg Oral Given 02/16/17 2323)  iopamidol (ISOVUE-300) 61 % injection (100 mLs  Contrast Given 02/17/17 0001)     Initial Impression / Assessment and Plan / ED Course  I have reviewed the triage vital signs  and the nursing notes.  Pertinent labs & imaging results that were available during my care of the patient were reviewed by me and considered in my medical decision making (see chart for details).     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  Afebrile, VSS.  No focal neuro deficits.  Lungs clear to auscultation bilaterally.  Abdomen with mild generalized TTP.  Pelvic exam showing no bleeding, discharge, or adnexal tenderness/fullness.  She has been noted to have complex ovarian cyst on previous abdominal imaging.  EKG showing normal sinus rhythm.  CTA abdomen/pelvis showing unremarkable exam.  Complete pelvic ultrasound showing unremarkable ultrasound no evidence of ovarian torsion.  CBC, CMP, lipase unremarkable.  Unclear etiology for syncopal episode and abdominal pain.  She is informed of imaging and lab findings.  No evidence of emergent etiology for symptoms.  Patient stable for  close PCP follow-up.  Return precautions provided for worsening symptoms. Pt will f/u with PCP at first availability. Pt verbalized agreement with plan.  Pt care d/w Dr. Silverio Lay  Final Clinical Impressions(s) / ED Diagnoses   Final diagnoses:  Pelvic pain  Generalized abdominal pain  Syncope and collapse    New Prescriptions New Prescriptions   No medications on file     Hebert Soho, MD 02/17/17 0331    Charlynne Pander, MD 02/20/17 [phone]

## 2017-02-17 ENCOUNTER — Other Ambulatory Visit (HOSPITAL_COMMUNITY): Payer: Self-pay

## 2017-02-17 ENCOUNTER — Emergency Department (HOSPITAL_COMMUNITY): Payer: Medicaid Other

## 2017-02-17 LAB — WET PREP, GENITAL
Sperm: NONE SEEN
TRICH WET PREP: NONE SEEN
YEAST WET PREP: NONE SEEN

## 2017-02-17 LAB — GC/CHLAMYDIA PROBE AMP (~~LOC~~) NOT AT ARMC
Chlamydia: NEGATIVE
Neisseria Gonorrhea: NEGATIVE

## 2017-02-17 MED ORDER — METRONIDAZOLE 500 MG PO TABS: 500.0000 mg | ORAL_TABLET | Freq: Two times a day (BID) | ORAL | 0 refills | Status: DC

## 2017-02-17 NOTE — Discharge Instructions (Addendum)
Please continue fluid hydration. Please followup with primary doctor at first availability.

## 2017-02-17 NOTE — ED Notes (Signed)
Introduced self to pt. Pt transported to [place]. Pt A&Ox4. NAD noted.

## 2020-04-15 ENCOUNTER — Emergency Department (HOSPITAL_COMMUNITY)
Admission: EM | Admit: 2020-04-15 | Discharge: 2020-04-16 | Disposition: A | Discharge status: Home / Self Care | Payer: Medicaid Other | Attending: Emergency Medicine | Admitting: Emergency Medicine

## 2020-04-15 ENCOUNTER — Other Ambulatory Visit: Payer: Self-pay

## 2020-04-15 ENCOUNTER — Encounter (HOSPITAL_COMMUNITY): Payer: Self-pay | Admitting: *Deleted

## 2020-04-15 DIAGNOSIS — I10 Essential (primary) hypertension: Secondary | ICD-10-CM | POA: Diagnosis not present

## 2020-04-15 DIAGNOSIS — Z7982 Long term (current) use of aspirin: Secondary | ICD-10-CM | POA: Insufficient documentation

## 2020-04-15 DIAGNOSIS — Z79899 Other long term (current) drug therapy: Secondary | ICD-10-CM | POA: Diagnosis not present

## 2020-04-15 DIAGNOSIS — R519 Headache, unspecified: Secondary | ICD-10-CM | POA: Diagnosis present

## 2020-04-15 DIAGNOSIS — G43109 Migraine with aura, not intractable, without status migrainosus: Secondary | ICD-10-CM

## 2020-04-15 DIAGNOSIS — R200 Anesthesia of skin: Secondary | ICD-10-CM | POA: Insufficient documentation

## 2020-04-15 LAB — CBC
HCT: 35.2 % — ABNORMAL LOW (ref 36.0–46.0)
Hemoglobin: 11.4 g/dL — ABNORMAL LOW (ref 12.0–15.0)
MCH: 28.9 pg (ref 26.0–34.0)
MCHC: 32.4 g/dL (ref 30.0–36.0)
MCV: 89.3 fL (ref 80.0–100.0)
Platelets: 359 10*3/uL (ref 150–400)
RBC: 3.94 MIL/uL (ref 3.87–5.11)
RDW: 13.8 % (ref 11.5–15.5)
WBC: 6.5 10*3/uL (ref 4.0–10.5)
nRBC: 0 % (ref 0.0–0.2)

## 2020-04-15 LAB — URINALYSIS, ROUTINE W REFLEX MICROSCOPIC
Bilirubin Urine: NEGATIVE
Glucose, UA: NEGATIVE mg/dL
Hgb urine dipstick: NEGATIVE
Ketones, ur: NEGATIVE mg/dL
Leukocytes,Ua: NEGATIVE
Nitrite: NEGATIVE
Protein, ur: NEGATIVE mg/dL
Specific Gravity, Urine: 1.021 (ref 1.005–1.030)
pH: 6 (ref 5.0–8.0)

## 2020-04-15 LAB — COMPREHENSIVE METABOLIC PANEL
ALT: 10 U/L (ref 0–44)
AST: 11 U/L — ABNORMAL LOW (ref 15–41)
Albumin: 3.9 g/dL (ref 3.5–5.0)
Alkaline Phosphatase: 41 U/L (ref 38–126)
Anion gap: 6 (ref 5–15)
BUN: 13 mg/dL (ref 6–20)
CO2: 20 mmol/L — ABNORMAL LOW (ref 22–32)
Calcium: 9 mg/dL (ref 8.9–10.3)
Chloride: 110 mmol/L (ref 98–111)
Creatinine, Ser: 0.79 mg/dL (ref 0.44–1.00)
GFR, Estimated: >60 mL/min (ref >60)
Glucose, Bld: 90 mg/dL (ref 70–99)
Potassium: 4 mmol/L (ref 3.5–5.1)
Sodium: 136 mmol/L (ref 135–145)
Total Bilirubin: 0.5 mg/dL (ref 0.3–1.2)
Total Protein: 6.7 g/dL (ref 6.5–8.1)

## 2020-04-15 LAB — I-STAT BETA HCG BLOOD, ED (MC, WL, AP ONLY): I-stat hCG, quantitative: <5 m[IU]/mL (ref <5)

## 2020-04-15 LAB — LIPASE, BLOOD: Lipase: 32 U/L (ref 11–51)

## 2020-04-15 NOTE — ED Triage Notes (Signed)
The pt arrived by [facility] from home  She is c/o a headache for 1-2 weeks  She reports that she has a history of her brain swelling  And she has periods of confusion.  Her speech is slow unknown if this is normal recent hemorroid surgery and she reports that she needs another  lmp nov 19  She reports that she is not pregnant

## 2020-04-16 ENCOUNTER — Emergency Department (HOSPITAL_COMMUNITY): Payer: Medicaid Other

## 2020-04-16 LAB — RAPID URINE DRUG SCREEN, HOSP PERFORMED
Amphetamines: NOT DETECTED
Barbiturates: NOT DETECTED
Benzodiazepines: NOT DETECTED
Cocaine: NOT DETECTED
Opiates: NOT DETECTED
Tetrahydrocannabinol: NOT DETECTED

## 2020-04-16 LAB — ETHANOL: Alcohol, Ethyl (B): <10 mg/dL (ref <10)

## 2020-04-16 MED ORDER — PROCHLORPERAZINE MALEATE 10 MG PO TABS: 10.0000 mg | ORAL_TABLET | Freq: Two times a day (BID) | ORAL | 0 refills | Status: AC | PRN

## 2020-04-16 MED ORDER — PROCHLORPERAZINE EDISYLATE 10 MG/2ML IJ SOLN
10.0000 mg | Freq: Once | INTRAMUSCULAR | Status: DC
  Filled 2020-04-16: qty 2

## 2020-04-16 MED ORDER — PROCHLORPERAZINE EDISYLATE 10 MG/2ML IJ SOLN
10.0000 mg | Freq: Once | INTRAMUSCULAR | Status: AC
  Administered 2020-04-16: 10 mg via INTRAMUSCULAR

## 2020-04-16 NOTE — ED Notes (Signed)
Patient transported to CT 

## 2020-04-16 NOTE — ED Notes (Signed)
Received from lobby at this time, ambulatory with steady gait.

## 2020-04-16 NOTE — ED Notes (Signed)
ED Provider at bedside. 

## 2020-04-16 NOTE — ED Provider Notes (Signed)
TIME SEEN: 4:06 AM  CHIEF COMPLAINT: Headache  HPI: Patient is a 34 year old female with history of seizures, neurofibromatosis, chronic headaches who presents to the emergency department with diffuse headache that she describes as feeling like her head is on fire.  No fevers.  Intermittent numbness in the left arm and leg have been ongoing for weeks.  No numbness, tingling or focal weakness currently.  It appears she has been seen at [facility], [facility], [facility] for the same.  She is followed by Quinn Plowman, PA with [facility].  States she had an MRI of her brain 3 weeks ago and was told it showed "swelling".  Reports she never followed up for this.  She states that she has been taking Fioricet and Klonopin without relief.  It also appears she is on propanolol Aiomovig.  She has tried multiple different medications for her headaches including Zanaflex, Depakote, amitriptyline, Xanax, Neurontin, Emgality.    It appears per neurology notes on 02/28/2020, MRI was negative for any acute findings and showed stable appearance of right thalamic T2 hyperintensity and slightly increased supratentorial white matter T2 hyperintensities.  They recommended outpatient MRA or CTA of the head which patient deferred at that time until November due to recently having hemorrhoidectomy.  Patient states that she has been intermittently confused and has a hard time following along with what other people are saying.  She also states her speech has been slurred and people think that she is intoxicated.    ROS: See HPI Constitutional: no fever  Eyes: no drainage  ENT: no runny nose   Cardiovascular:  no chest pain  Resp: no SOB  GI: no vomiting GU: no dysuria Integumentary: no rash  Allergy: no hives  Musculoskeletal: no leg swelling  Neurological: no slurred speech ROS otherwise negative  PAST MEDICAL HISTORY/PAST SURGICAL HISTORY:  Past Medical History:  Diagnosis Date   . Anxiety   . Depression   . Gestational diabetes   . Hypertension    Dx 22  . Mental disorder   . Neuromuscular disorder (HCC)   . Seizures (HCC)     MEDICATIONS:  Prior to Admission medications   Medication Sig Start Date End Date Taking? Authorizing Provider  aspirin-acetaminophen-caffeine (EXCEDRIN MIGRAINE) [phone] MG tablet Take 2 tablets by mouth 2 (two) times daily as needed for headache.    [provider]  butalbital-acetaminophen-caffeine (FIORICET, ESGIC) 50-325-40 MG per tablet Take 1 tablet by mouth 3 (three) times daily as needed for headache (headache).     [provider]  clonazePAM (KLONOPIN) 1 MG tablet Take 1 mg by mouth 2 (two) times daily.    [provider]  Diclofenac Potassium 50 MG PACK Take 50 mg by mouth as needed.  11/25/16   [provider]  ERENUMAB-AOOE [place] Inject 140 mg into the skin. 01/26/17   [provider]  escitalopram (LEXAPRO) 20 MG tablet Take 20 mg by mouth daily.     [provider]  furosemide (LASIX) 20 MG tablet Take 1 tablet (20 mg total) by mouth daily. Patient not taking: Reported on 02/16/2017 01/29/16   Jaynie Crumble, PA-C  ibuprofen (ADVIL,MOTRIN) 400 MG tablet Take 1 tablet (400 mg total) by mouth 3 (three) times daily. Patient taking differently: Take 600 mg by mouth daily as needed (cramping and leg pain).  11/02/15   Azalia Bilis, MD  levETIRAcetam (KEPPRA XR) 500 MG 24 hr tablet Take 1,500 mg by mouth 3 (three) times daily. 07/13/16  07/13/17  [provider]  methocarbamol (ROBAXIN) 500 MG tablet Take 1 tablet (500 mg total) by mouth 2 (two) times daily. Patient not taking: Reported on 01/29/2016 08/26/15   Roxy Horseman, PA-C  metroNIDAZOLE (FLAGYL) 500 MG tablet Take 1 tablet (500 mg total) by mouth 2 (two) times daily. 02/17/17   Lorre Nick, MD  ondansetron (ZOFRAN) 4 MG tablet Take 1 tablet (4 mg total) by mouth every 6 (six) hours. Patient not taking:  Reported on 02/16/2017 07/29/15   Barrett, Rolm Gala, PA-C  propranolol (INDERAL) 80 MG tablet Take 80 mg by mouth daily.     [provider]    ALLERGIES:  Allergies  Allergen Reactions  . Haldol [Haloperidol] Other (See Comments)    Tightness in chest hard time breathing Tightness in chest hard time breathing  . Imitrex [Sumatriptan] Other (See Comments)    Tightening of jaws.  Tightening of jaws.     SOCIAL HISTORY:  Social History   Tobacco Use  . Smoking status: Never Smoker  . Smokeless tobacco: Never Used  Substance Use Topics  . Alcohol use: No    FAMILY HISTORY: No family history on file.  EXAM: BP 118/77   Pulse 86   Temp 98.5 F (36.9 C) (Oral)   Resp 17   Ht 4\' 11"  (1.499 m)   Wt 45.4 kg   LMP 03/27/2020   SpO2 98%   BMI 20.22 kg/m  CONSTITUTIONAL: Alert and oriented and responds appropriately to questions.  Thin, chronically ill-appearing HEAD: Normocephalic, atraumatic EYES: Conjunctivae clear, pupils appear equal, EOM appear intact ENT: normal nose; moist mucous membranes NECK: Supple, normal ROM CARD: RRR; S1 and S2 appreciated; no murmurs, no clicks, no rubs, no gallops RESP: Normal chest excursion without splinting or tachypnea; breath sounds clear and equal bilaterally; no wheezes, no rhonchi, no rales, no hypoxia or respiratory distress, speaking full sentences ABD/GI: Normal bowel sounds; non-distended; soft, non-tender, no rebound, no guarding, no peritoneal signs, no hepatosplenomegaly BACK:  The back appears normal EXT: Normal ROM in all joints; no deformity noted, no edema; no cyanosis SKIN: Normal color for age and race; warm; no rash on exposed skin NEURO: Moves all extremities equally, normal sensation diffusely, cranial nerves II through XII intact, normal speech, no dysarthria or aphasia, normal gait PSYCH: The patient's mood and manner are appropriate.   MEDICAL DECISION MAKING: Patient here with complaints of headaches,  intermittent left-sided numbness, confusion and speech changes.  It appears most of this is chronic for her and she is being followed by an outpatient neurologist but tells me that she had an abnormal brain MRI 3 weeks ago.  Unfortunately I am not able to see the report for this MRI but I am able to see her neurologist note that there were no acute abnormality seen at that time.  Will obtain a head CT today given she is complaining of worsening confusion and slurred speech.  She states that people question whether or not she is intoxicated and she does seem intoxicated here today.  She is prescribed Klonopin regularly and has been receiving intermittent pain medication after hemorrhoidectomy.  Her overdose score on the controlled substance reporting system is 470.  I wonder if there is some component of substance related issues present today.  Labs obtained in triage are reassuring.  I will add on urine drug screen and ethanol level.  Will give Compazine for symptomatic relief.  I have low suspicion that I will get patient's headache improved today  based on what I have been able to read in her neurology notes.  ED PROGRESS: Patient's headache is resolving with IM Compazine and she is resting well.  Will discharge with prescription of oral Compazine.  She states her mother can take her home.  Recommended follow-up with her outpatient neurologist.  Labs, urine, imaging showed no acute abnormality today.  I do not feel she needs further emergent work-up.  Numbness, tingling have been ongoing for 3 weeks and are related to her headaches.  Suspect complex migraines.  At this time, I do not feel there is any life-threatening condition present. I have reviewed, interpreted and discussed all results (EKG, imaging, lab, urine as appropriate) and exam findings with patient/family. I have reviewed nursing notes and appropriate previous records.  I feel the patient is safe to be discharged home without further emergent  workup and can continue workup as an outpatient as needed. Discussed usual and customary return precautions. Patient/family verbalize understanding and are comfortable with this plan.  Outpatient follow-up has been provided as needed. All questions have been answered.      Farheen Pfahler was evaluated in Emergency Department on 04/16/2020 for the symptoms described in the history of present illness. She was evaluated in the context of the global COVID-19 pandemic, which necessitated consideration that the patient might be at risk for infection with the SARS-CoV-2 virus that causes COVID-19. Institutional protocols and algorithms that pertain to the evaluation of patients at risk for COVID-19 are in a state of rapid change based on information released by regulatory bodies including the CDC and federal and state organizations. These policies and algorithms were followed during the patient's care in the ED.      Emon Lance, Layla Maw, DO 04/16/20 [phone]

## 2020-04-19 ENCOUNTER — Emergency Department (HOSPITAL_COMMUNITY)
Admission: EM | Admit: 2020-04-19 | Discharge: 2020-04-20 | Disposition: A | Discharge status: Home / Self Care | Payer: Medicaid Other | Attending: Emergency Medicine | Admitting: Emergency Medicine

## 2020-04-19 ENCOUNTER — Other Ambulatory Visit: Payer: Self-pay

## 2020-04-19 ENCOUNTER — Emergency Department (HOSPITAL_COMMUNITY): Payer: Medicaid Other

## 2020-04-19 ENCOUNTER — Encounter (HOSPITAL_COMMUNITY): Payer: Self-pay

## 2020-04-19 DIAGNOSIS — R202 Paresthesia of skin: Secondary | ICD-10-CM | POA: Diagnosis not present

## 2020-04-19 DIAGNOSIS — R531 Weakness: Secondary | ICD-10-CM | POA: Diagnosis not present

## 2020-04-19 DIAGNOSIS — Z79899 Other long term (current) drug therapy: Secondary | ICD-10-CM | POA: Diagnosis not present

## 2020-04-19 DIAGNOSIS — W19XXXA Unspecified fall, initial encounter: Secondary | ICD-10-CM | POA: Diagnosis not present

## 2020-04-19 DIAGNOSIS — G43009 Migraine without aura, not intractable, without status migrainosus: Secondary | ICD-10-CM | POA: Diagnosis not present

## 2020-04-19 DIAGNOSIS — G4489 Other headache syndrome: Secondary | ICD-10-CM | POA: Diagnosis not present

## 2020-04-19 DIAGNOSIS — R519 Headache, unspecified: Secondary | ICD-10-CM | POA: Insufficient documentation

## 2020-04-19 DIAGNOSIS — I10 Essential (primary) hypertension: Secondary | ICD-10-CM | POA: Diagnosis not present

## 2020-04-19 DIAGNOSIS — R930 Abnormal findings on diagnostic imaging of skull and head, not elsewhere classified: Secondary | ICD-10-CM | POA: Diagnosis not present

## 2020-04-19 LAB — BASIC METABOLIC PANEL
Anion gap: 8 (ref 5–15)
BUN: 9 mg/dL (ref 6–20)
CO2: 19 mmol/L — ABNORMAL LOW (ref 22–32)
Calcium: 9.2 mg/dL (ref 8.9–10.3)
Chloride: 111 mmol/L (ref 98–111)
Creatinine, Ser: 0.75 mg/dL (ref 0.44–1.00)
GFR, Estimated: >60 mL/min (ref >60)
Glucose, Bld: 99 mg/dL (ref 70–99)
Potassium: 4.5 mmol/L (ref 3.5–5.1)
Sodium: 138 mmol/L (ref 135–145)

## 2020-04-19 LAB — I-STAT BETA HCG BLOOD, ED (MC, WL, AP ONLY): I-stat hCG, quantitative: <5 m[IU]/mL (ref <5)

## 2020-04-19 LAB — CBC
HCT: 35.7 % — ABNORMAL LOW (ref 36.0–46.0)
Hemoglobin: 11.6 g/dL — ABNORMAL LOW (ref 12.0–15.0)
MCH: 28.9 pg (ref 26.0–34.0)
MCHC: 32.5 g/dL (ref 30.0–36.0)
MCV: 88.8 fL (ref 80.0–100.0)
Platelets: 365 10*3/uL (ref 150–400)
RBC: 4.02 MIL/uL (ref 3.87–5.11)
RDW: 13.6 % (ref 11.5–15.5)
WBC: 5.9 10*3/uL (ref 4.0–10.5)
nRBC: 0 % (ref 0.0–0.2)

## 2020-04-19 LAB — CBG MONITORING, ED: Glucose-Capillary: 75 mg/dL (ref 70–99)

## 2020-04-19 LAB — TROPONIN I (HIGH SENSITIVITY)
Troponin I (High Sensitivity): <2 ng/L (ref <18)
Troponin I (High Sensitivity): <2 ng/L (ref <18)

## 2020-04-19 MED ORDER — PROMETHAZINE HCL 25 MG/ML IJ SOLN
25.0000 mg | Freq: Once | INTRAMUSCULAR | Status: AC
  Administered 2020-04-19: 19:00:00 25 mg via INTRAVENOUS
  Filled 2020-04-19: qty 1

## 2020-04-19 MED ORDER — SODIUM CHLORIDE 0.9 % IV BOLUS
1000.0000 mL | Freq: Once | INTRAVENOUS | Status: AC
  Administered 2020-04-19: 22:00:00 1000 mL via INTRAVENOUS

## 2020-04-19 MED ORDER — GADOBUTROL 1 MMOL/ML IV SOLN
4.5000 mL | Freq: Once | INTRAVENOUS | Status: AC | PRN
  Administered 2020-04-19: 4.5 mL via INTRAVENOUS

## 2020-04-19 MED ORDER — BUTALBITAL-APAP-CAFFEINE 50-325-40 MG PO TABS
1.0000 | ORAL_TABLET | Freq: Once | ORAL | Status: AC
  Administered 2020-04-19: 19:00:00 1 via ORAL
  Filled 2020-04-19: qty 1

## 2020-04-19 MED ORDER — SODIUM CHLORIDE 0.9 % IV BOLUS
1000.0000 mL | Freq: Once | INTRAVENOUS | Status: AC
  Administered 2020-04-19: 19:00:00 1000 mL via INTRAVENOUS

## 2020-04-19 MED ORDER — DEXAMETHASONE SODIUM PHOSPHATE 4 MG/ML IJ SOLN
4.0000 mg | Freq: Once | INTRAMUSCULAR | Status: AC
  Administered 2020-04-19: 19:00:00 4 mg via INTRAVENOUS
  Filled 2020-04-19: qty 1

## 2020-04-19 NOTE — ED Notes (Signed)
Patient returned from MRI.

## 2020-04-19 NOTE — ED Notes (Signed)
Patient transported to CT 

## 2020-04-19 NOTE — ED Notes (Signed)
Patient transported to MRI 

## 2020-04-19 NOTE — ED Provider Notes (Signed)
[facility] EMERGENCY DEPARTMENT Provider Note   CSN: [id number] Arrival date & time: 04/19/20  1329     History No chief complaint on file.   Kristy Campbell is a 34 y.o. female history of normal fibroma, hypertension, seizures here presenting with numbness and weakness.  Patient states that she has been weak and numb on the left side for the last several weeks.  Patient had a fall about a month ago and hit her face.  She has multiple evaluations previously at [place] and [place] including MRI of the brain recently.  She was seen here 3 days ago for trouble speaking and a CT head and labs were unremarkable.  She was thought to have complex migraines.  She does have neurology follow-up already.  She states that she is here because she has worsening numbness that been going on for the last several weeks that has not been improving.  She also has some headaches as well.  She states that her neurology appointment is in several weeks.   The history is provided by the patient.       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  Social History   Tobacco Use   Smoking status:  Never Smoker   Smokeless tobacco: Never Used  Substance Use Topics   Alcohol use: No   Drug use: No    Home Medications Prior to Admission medications   Medication Sig Start Date End Date Taking? Authorizing Provider  aspirin-acetaminophen-caffeine (EXCEDRIN MIGRAINE) [phone] MG tablet Take 2 tablets by mouth 2 (two) times daily as needed for headache.    [provider]  butalbital-acetaminophen-caffeine (FIORICET, ESGIC) 50-325-40 MG per tablet Take 1 tablet by mouth 3 (three) times daily as needed for headache (headache).     [provider]  clonazePAM (KLONOPIN) 1 MG tablet Take 1 mg by mouth 2 (two) times daily.    [provider]  diclofenac (VOLTAREN) 50 MG EC tablet Take 50 mg by mouth 3 (three) times daily. 03/19/20   [provider]  Erenumab-aooe (AIMOVIG) 140 MG/ML SOAJ Inject into the skin. 12/10/19   [provider]  escitalopram (LEXAPRO) 20 MG tablet Take 20 mg by mouth daily.     [provider]  ibuprofen (ADVIL,MOTRIN) 400 MG tablet Take 1 tablet (400 mg total) by mouth 3 (  three) times daily. Patient taking differently: Take 600 mg by mouth daily as needed (cramping and leg pain). 11/02/15   Azalia Bilis, MD  levETIRAcetam (KEPPRA XR) 500 MG 24 hr tablet Take 2,000 mg by mouth daily. 07/13/16 04/16/20  [provider]  lidocaine (XYLOCAINE) 5 % ointment Apply 1 application topically 3 (three) times daily as needed for mild pain. 02/24/20   [provider]  methocarbamol (ROBAXIN) 500 MG tablet Take 1 tablet (500 mg total) by mouth 2 (two) times daily. Patient not taking: No sig reported 08/26/15   Roxy Horseman, PA-C  ondansetron (ZOFRAN-ODT) 4 MG disintegrating tablet Take 4 mg by mouth 2 (two) times daily as needed for nausea or vomiting. 03/09/20   [provider]  pantoprazole (PROTONIX) 40 MG tablet Take 40 mg by mouth daily.    [provider]  polyethylene glycol powder  (GLYCOLAX/MIRALAX) 17 GM/SCOOP powder Take 17 g by mouth daily as needed for mild constipation. 03/19/20   [provider]  prochlorperazine (COMPAZINE) 10 MG tablet Take 1 tablet (10 mg total) by mouth 2 (two) times daily as needed for nausea or vomiting (headache). 04/16/20   Ward, Layla Maw, DO  propranolol (INDERAL) 80 MG tablet Take 40 mg by mouth 2 (two) times daily.    [provider]  zonisamide (ZONEGRAN) 100 MG capsule Take 200 mg by mouth daily. 04/08/20   [provider]    Allergies    Triptans, Haldol [haloperidol], Imitrex [sumatriptan], Depakote [divalproex sodium], and Ubrogepant  Review of Systems   Review of Systems  Neurological: Positive for numbness and headaches.  All other systems reviewed and are negative.   Physical Exam Updated Vital Signs BP (!) 131/91    Pulse 83    Temp 98.3 F (36.8 C) (Oral)    Resp 16    LMP 03/27/2020    SpO2 100%   Physical Exam Vitals and nursing note reviewed.  Constitutional:      Comments: Anxious.  HENT:     Head: Normocephalic and atraumatic.     Nose: Nose normal.     Mouth/Throat:     Mouth: Mucous membranes are moist.  Eyes:     Extraocular Movements: Extraocular movements intact.     Pupils: Pupils are equal, round, and reactive to light.  Cardiovascular:     Rate and Rhythm: Normal rate and regular rhythm.     Pulses: Normal pulses.     Heart sounds: Normal heart sounds.  Pulmonary:     Effort: Pulmonary effort is normal.     Breath sounds: Normal breath sounds.  Abdominal:     General: Abdomen is flat.     Palpations: Abdomen is soft.  Musculoskeletal:        General: Normal range of motion.     Cervical back: Normal range of motion and neck supple.  Skin:    General: Skin is warm.     Capillary Refill: Capillary refill takes less than 2 seconds.  Neurological:     General: No focal deficit present.     Mental Status: She is oriented to person, place, and time.     Comments:  Cranial nerves II to XII is intact and no facial droop, normal finger-to-nose bilaterally and normal strength and sensation bilaterally.   Psychiatric:        Mood and Affect: Mood normal.        Behavior: Behavior normal.     ED Results / Procedures / Treatments  Labs (all labs ordered are listed, but only abnormal results are displayed) Labs Reviewed  BASIC METABOLIC PANEL - Abnormal; Notable for the following components:      Result Value   CO2 19 (*)    All other components within normal limits  CBC - Abnormal; Notable for the following components:   Hemoglobin 11.6 (*)    HCT 35.7 (*)    All other components within normal limits  I-STAT BETA HCG BLOOD, ED ([facility], [facility], [facility] ONLY)  CBG MONITORING, ED  TROPONIN I (HIGH SENSITIVITY)  TROPONIN I (HIGH SENSITIVITY)    EKG EKG Interpretation  Date/Time:  Sunday April 19 2020 13:55:56 EST Ventricular Rate:  97 PR Interval:  122 QRS Duration: 76 QT Interval:  358 QTC Calculation: 454 R Axis:   44 Text Interpretation: Normal sinus rhythm T wave abnormality, consider anterior ischemia Abnormal ECG No significant change since last tracing Confirmed by Richardean Canal [phone]) on 04/19/2020 6:06:44 PM   Radiology DG Chest 2 View  Result Date: 04/19/2020 CLINICAL DATA:  Chest pain, numbness in hands and fingers EXAM: CHEST - 2 VIEW COMPARISON:  Chest radiograph 01/07/2020 FINDINGS: The cardiomediastinal contours are within normal limits. The lungs are clear. No pneumothorax or pleural effusion. No acute finding in the visualized skeleton. IMPRESSION: No acute cardiopulmonary process. Electronically Signed   By: Emmaline Kluver M.D.   On: 04/19/2020 15:21    Procedures Procedures (including critical care time)  Medications Ordered in ED Medications  sodium chloride 0.9 % bolus 1,000 mL (1,000 mLs Intravenous New Bag/Given 04/19/20 1852)  promethazine (PHENERGAN) injection 25 mg (25 mg Intravenous Given 04/19/20 1855)   butalbital-acetaminophen-caffeine (FIORICET) 50-325-40 MG per tablet 1 tablet (1 tablet Oral Given 04/19/20 1857)  dexamethasone (DECADRON) injection 4 mg (4 mg Intravenous Given 04/19/20 1853)    ED Course  I have reviewed the triage vital signs and the nursing notes.  Pertinent labs & imaging results that were available during my care of the patient were reviewed by me and considered in my medical decision making (see chart for details).    MDM Rules/Calculators/A&P                         Kristy Campbell is a 34 y.o. female here with headaches and numbness.  I do not see any obvious neuro deficits right now.  She has multiple evaluations including MRI recently and CT head 3 days ago.  I think likely complex migraine.  Will give migraine cocktail and reassess.  Of note, patient does have some vague chest pain going on for weeks.  Will get troponin x2 but I doubt ACS or PE or dissection  8:16 PM I was notified by nursing that she actually fell and hit her head.  Will get CT head at this point.   11:18 PM Patient very confused after phenergan. I think likely side effect of her meds. Difficult to understand. Previous MRI showed R thalamic T2 hyperintensity and increased supratentorial white matter T2 hyperintensities and recommend MRA. I talked to Dr. Thomasena Edis who recommend MRI w/wo to r/o MS vs stroke and MRA. Signed out to Dr. Bebe Shaggy in the ED to follow up MRI results.   Final Clinical Impression(s) / ED Diagnoses Final diagnoses:  None    Rx / DC Orders ED Discharge Orders    None       Charlynne Pander, MD 04/19/20 2320

## 2020-04-19 NOTE — ED Notes (Signed)
Patient walked to restroom. Per patient she stood to pull her pants up and she fell and hit her head on the wall. She then walked back to the room and sat on the bed. No injury present on the left side of the face. Provider made aware.

## 2020-04-19 NOTE — ED Triage Notes (Signed)
Patient complains of cp 2 weeks ago and has had numbness in extremities. Complains of general weakness. Patient alert and oriented on arrival.

## 2020-04-20 DIAGNOSIS — R569 Unspecified convulsions: Secondary | ICD-10-CM

## 2020-04-20 DIAGNOSIS — R202 Paresthesia of skin: Secondary | ICD-10-CM

## 2020-04-20 DIAGNOSIS — Q8501 Neurofibromatosis, type 1: Secondary | ICD-10-CM

## 2020-04-20 DIAGNOSIS — R930 Abnormal findings on diagnostic imaging of skull and head, not elsewhere classified: Secondary | ICD-10-CM

## 2020-04-20 DIAGNOSIS — G43009 Migraine without aura, not intractable, without status migrainosus: Secondary | ICD-10-CM

## 2020-04-20 DIAGNOSIS — G4489 Other headache syndrome: Secondary | ICD-10-CM

## 2020-04-20 NOTE — ED Notes (Signed)
Ambulated patient to bathroom and back. Patient continued to be unsteady and weakened on left side.

## 2020-04-20 NOTE — ED Provider Notes (Signed)
Patient is awake alert appears improved.  She was seen by neurology Dr. Thomasena Edis.  He feels this is less likely stroke.  We both agree the patient is appropriate for discharge home.  She tells me that she has a neurology outpatient follow-up later this week. Patient will  be discharged home   Kristy Rhine, MD 04/20/20 0530

## 2020-04-20 NOTE — Consult Note (Signed)
Neurology Consult H&P  CC: left sided numbness  History is obtained from: patient and chart review  HPI: Kristy Campbell is a 34 y.o. 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 Patient is soft spoken and needed redirection however, she states that she has been numb on her left side for the last several weeks. She adds that a month ago she was lying on the couch after her surgery and trying to eat a taco? Her son brought her and may have fallen and her face. No LOC.  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 She was seen here 3 days ago for trouble speaking and a CT head and labs were unremarkable. She was thought to have complex migraines. She does have neurology follow-up already. She states that she is here because she has worsening of her intermittent numbness over the last 3-4 weeks. The numbness fluctuates in character at times resolves and at times migrates and may correlate with her stress levels. States that her neurology appointment is soon.  She has been on keppra er 2,000mg  and zonisamide 100mg   Nightly and she thinks the keppra is adding to her stress and anger.  She has decreased frustration tolerance.  She has blurry vision which resolves when she squints or covers one eye.  Denies dizziness, hearing loss, fever chills.    ROS: A complete ROS was performed and is negative except as noted in the HPI.   Past Medical History:  Diagnosis Date  . Anxiety   . Depression   . Gestational diabetes   . Hypertension    Dx 22  . Mental disorder   . Neuromuscular disorder (HCC)   . Seizures (HCC)    Migraine  Neurofibromatosis type 1  Past Surgical History CESAREAN SECTION  Hemorrhoidectomy  Family History  Family History  Problem Relation Age of  Onset  Diabetes Maternal Grandfather  Neurofibromatosis Mother  Neurofibromatosis Maternal Uncle  Neurofibromatosis Maternal Grandmother     Social History:  reports that she has never smoked. She has never used smokeless tobacco. She reports that she does not drink alcohol and does not use drugs.   Prior to Admission medications   Medication Sig Start Date End Date Taking? Authorizing Provider  aspirin-acetaminophen-caffeine (EXCEDRIN MIGRAINE) [phone] MG tablet Take 2 tablets by mouth 2 (two) times daily as needed for headache.   Yes [provider]  B Complex Vitamins (VITAMIN B COMPLEX) TABS Take 1 tablet by mouth daily.   Yes [provider]  butalbital-acetaminophen-caffeine (FIORICET, ESGIC) 50-325-40 MG per tablet Take 1 tablet by mouth 3 (three) times daily as needed for headache (headache).    Yes [provider]  clonazePAM (KLONOPIN) 1 MG tablet Take 1 mg by mouth 2 (two) times daily.   Yes [provider]  diclofenac (VOLTAREN) 50 MG EC tablet Take 50 mg by mouth 3 (three) times daily. 03/19/20  Yes [provider]  Erenumab-aooe (AIMOVIG) 140 MG/ML SOAJ Inject into the skin. 12/10/19  Yes [provider]  escitalopram (LEXAPRO) 20 MG tablet Take 20 mg by mouth daily.    Yes [provider]  ferrous sulfate 325 (65 FE) MG tablet Take 325 mg by mouth daily.   Yes [provider]  ibuprofen (ADVIL,MOTRIN) 400 MG tablet Take 1 tablet (400 mg total) by  mouth 3 (three) times daily. Patient taking differently: Take 600 mg by mouth daily as needed (cramping and leg pain). 11/02/15  Yes Azalia Bilis, MD  levETIRAcetam (KEPPRA XR) 500 MG 24 hr tablet Take 2,000 mg by mouth daily. 07/13/16 04/19/20 Yes [provider]  lidocaine (XYLOCAINE) 5 % ointment Apply 1 application topically 3 (three) times daily as needed for mild pain. 02/24/20  Yes [provider]  ondansetron (ZOFRAN-ODT) 4 MG  disintegrating tablet Take 4 mg by mouth 2 (two) times daily as needed for nausea or vomiting. 03/09/20  Yes [provider]  pantoprazole (PROTONIX) 40 MG tablet Take 40 mg by mouth daily.   Yes [provider]  polyethylene glycol powder (GLYCOLAX/MIRALAX) 17 GM/SCOOP powder Take 17 g by mouth daily as needed for mild constipation. 03/19/20  Yes [provider]  prochlorperazine (COMPAZINE) 10 MG tablet Take 1 tablet (10 mg total) by mouth 2 (two) times daily as needed for nausea or vomiting (headache). 04/16/20  Yes Ward, Layla Maw, DO  propranolol (INDERAL) 80 MG tablet Take 40 mg by mouth 2 (two) times daily.   Yes [provider]  zonisamide (ZONEGRAN) 100 MG capsule Take 200 mg by mouth daily. 04/08/20  Yes [provider]  methocarbamol (ROBAXIN) 500 MG tablet Take 1 tablet (500 mg total) by mouth 2 (two) times daily. Patient not taking: No sig reported 08/26/15   Roxy Horseman, PA-C    Exam: Current vital signs: BP 136/88   Pulse 97   Temp 98 F (36.7 C) (Oral)   Resp 16   Ht 4\' 11"  (1.499 m)   Wt 45.4 kg   LMP 03/27/2020   SpO2 100%   BMI 20.20 kg/m   Physical Exam  Constitutional: Appears well-developed and well-nourished.  Psych: Affect appropriate to situation Eyes: No scleral injection HENT: No OP obstrucion Head: Normocephalic.  Cardiovascular: Normal rate and regular rhythm.  Respiratory: Effort normal and breath sounds normal to anterior ascultation GI: Soft.  No distension. There is no tenderness.  Skin: WDI  Neuro: Mental Status: Patient is awake, alert, oriented to person, place, month, year, and situation. Patient is able to give a clear and coherent history. No signs of aphasia or neglect. Cranial Nerves: II: Visual Fields are full. Pupils are equal, round, and reactive to light. III,IV, VI: EOMI without ptosis or diploplia.  V: Facial sensation is symmetric to temperature VII: Facial movement is symmetric.   VIII: hearing is intact to voice X: Uvula elevates symmetrically XI: Shoulder shrug is symmetric. XII: tongue is midline without atrophy or fasciculations.  Motor: Tone is normal. Bulk is normal. 5/5 strength was present in all four extremities. Sensory: Sensation is normal on right and increased cold in left upper and decreased cold on left lower extremities. Deep Tendon Reflexes: 2+ and symmetric in the biceps and patellae. Plantars: Toes are downgoing bilaterally. Cerebellar: FNF and HKS are intact bilaterally.  I have reviewed labs in epic and the pertinent results are:   I have reviewed the images obtained: MRI brain showed No acute intracranial abnormality. Subcentimeter focus of T2/FLAIR signal abnormality in right thalamus which may suggest small chronic lacunar infarction.   This has an appearance most typical of a on today's exam. Mild scattered subcentimeter foci of FLAIR hyperintensity involving the periventricular and subcortical white matter of both cerebral hemispheres, nonspecific, but overall mild for age and stable from prior. Primary differential considerations include changes of mild/early chronic microvascular ischemic disease versus complicated migraines.  MRA head and neck no large vessel occlusion, hemodynamically significant stenosis, or vasculitis/moyamoya.  Assessment: Kristy Campbell is a 34 y.o. 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 Exam is reassuring and only showed increased cold sensation in left arm and decreased cold sensation in left leg which would not be consistent stroke. Her symptoms fluctuate and at times correlate with stress (understandably as a single mom with a 7 year and old deadbeat dad). MRA did not show  or vasculitis or moyamoya which is  sometimes seen with NF1. She does not have significant vascular risk factors and both migraines and Keppra can cause similar findings. She has follow up in two days with her neurologist and recommend she discuss her symptoms with her neurologist and also to discuss cognitive behavioral therapy. She can try vitamin B6 150-200mg  daily to alleviate labile mood secondary to keppra or discuss transitioning off and maximizing zonisamde.  Electronically signed by: Dr. Marisue Humble Pager: [phone] 04/20/2020, 4:57 AM

## 2020-04-20 NOTE — ED Provider Notes (Signed)
I assumed care in signout to follow-up on MR imaging.  No acute findings were noted.  Patient still appears very drowsy and was unsteady with walking.  I have consulted neurology Dr. Thomasena Edis to evaluate the patient.  Awaiting final disposition   Zadie Rhine, MD 04/20/20 [phone]

## 2020-04-20 NOTE — Discharge Instructions (Addendum)

## 2020-04-21 ENCOUNTER — Emergency Department (HOSPITAL_COMMUNITY)
Admission: EM | Admit: 2020-04-21 | Discharge: 2020-04-22 | Disposition: A | Discharge status: Home / Self Care | Payer: Medicaid Other | Attending: Emergency Medicine | Admitting: Emergency Medicine

## 2020-04-21 ENCOUNTER — Emergency Department (HOSPITAL_COMMUNITY): Payer: Medicaid Other

## 2020-04-21 ENCOUNTER — Encounter (HOSPITAL_COMMUNITY): Payer: Self-pay | Admitting: Emergency Medicine

## 2020-04-21 DIAGNOSIS — S29012A Strain of muscle and tendon of back wall of thorax, initial encounter: Secondary | ICD-10-CM | POA: Insufficient documentation

## 2020-04-21 DIAGNOSIS — Z7982 Long term (current) use of aspirin: Secondary | ICD-10-CM | POA: Insufficient documentation

## 2020-04-21 DIAGNOSIS — X58XXXA Exposure to other specified factors, initial encounter: Secondary | ICD-10-CM | POA: Diagnosis not present

## 2020-04-21 DIAGNOSIS — I10 Essential (primary) hypertension: Secondary | ICD-10-CM | POA: Insufficient documentation

## 2020-04-21 DIAGNOSIS — R519 Headache, unspecified: Secondary | ICD-10-CM | POA: Diagnosis not present

## 2020-04-21 DIAGNOSIS — R0789 Other chest pain: Secondary | ICD-10-CM | POA: Insufficient documentation

## 2020-04-21 DIAGNOSIS — R202 Paresthesia of skin: Secondary | ICD-10-CM | POA: Diagnosis not present

## 2020-04-21 DIAGNOSIS — M549 Dorsalgia, unspecified: Secondary | ICD-10-CM | POA: Diagnosis present

## 2020-04-21 LAB — TROPONIN I (HIGH SENSITIVITY)
Troponin I (High Sensitivity): <2 ng/L (ref <18)
Troponin I (High Sensitivity): 3 ng/L (ref <18)

## 2020-04-21 LAB — BASIC METABOLIC PANEL
Anion gap: 7 (ref 5–15)
BUN: 7 mg/dL (ref 6–20)
CO2: 20 mmol/L — ABNORMAL LOW (ref 22–32)
Calcium: 8.9 mg/dL (ref 8.9–10.3)
Chloride: 109 mmol/L (ref 98–111)
Creatinine, Ser: 0.76 mg/dL (ref 0.44–1.00)
GFR, Estimated: >60 mL/min (ref >60)
Glucose, Bld: 88 mg/dL (ref 70–99)
Potassium: 4.1 mmol/L (ref 3.5–5.1)
Sodium: 136 mmol/L (ref 135–145)

## 2020-04-21 LAB — CBC
HCT: 35.5 % — ABNORMAL LOW (ref 36.0–46.0)
Hemoglobin: 11.1 g/dL — ABNORMAL LOW (ref 12.0–15.0)
MCH: 28.1 pg (ref 26.0–34.0)
MCHC: 31.3 g/dL (ref 30.0–36.0)
MCV: 89.9 fL (ref 80.0–100.0)
Platelets: 394 10*3/uL (ref 150–400)
RBC: 3.95 MIL/uL (ref 3.87–5.11)
RDW: 14 % (ref 11.5–15.5)
WBC: 5 10*3/uL (ref 4.0–10.5)
nRBC: 0 % (ref 0.0–0.2)

## 2020-04-21 LAB — I-STAT BETA HCG BLOOD, ED (MC, WL, AP ONLY): I-stat hCG, quantitative: <5 m[IU]/mL (ref <5)

## 2020-04-21 MED ORDER — ACETAMINOPHEN 325 MG PO TABS
650.0000 mg | ORAL_TABLET | Freq: Once | ORAL | Status: AC
  Administered 2020-04-21: 20:00:00 650 mg via ORAL
  Filled 2020-04-21: qty 2

## 2020-04-21 NOTE — ED Triage Notes (Addendum)
Pt reports ongoing chest pain and migraine since Sunday, was seen here and discharged with dx of complex migraine. Pt states she was being ruled out for a stroke. Also endorses tightness to her L arm. Moves all extremities. Speech clear, face symmetrical.

## 2020-04-22 ENCOUNTER — Other Ambulatory Visit: Payer: Self-pay

## 2020-04-22 MED ORDER — KETOROLAC TROMETHAMINE 15 MG/ML IJ SOLN
15.0000 mg | Freq: Once | INTRAMUSCULAR | Status: AC
  Administered 2020-04-22: 01:00:00 15 mg via INTRAVENOUS
  Filled 2020-04-22: qty 1

## 2020-04-22 MED ORDER — PROPOFOL 1000 MG/100ML IV EMUL
INTRAVENOUS | Status: AC
  Filled 2020-04-22: qty 100

## 2020-04-22 MED ORDER — DIPHENHYDRAMINE HCL 50 MG/ML IJ SOLN
12.5000 mg | Freq: Once | INTRAMUSCULAR | Status: AC
  Administered 2020-04-22: 01:00:00 12.5 mg via INTRAVENOUS
  Filled 2020-04-22: qty 1

## 2020-04-22 MED ORDER — PROCHLORPERAZINE EDISYLATE 10 MG/2ML IJ SOLN
10.0000 mg | Freq: Once | INTRAMUSCULAR | Status: AC
  Administered 2020-04-22: 01:00:00 10 mg via INTRAVENOUS
  Filled 2020-04-22: qty 2

## 2020-04-22 NOTE — Discharge Instructions (Signed)
You may use over-the-counter Motrin (Ibuprofen), Acetaminophen (Tylenol), topical muscle creams such as SalonPas, Icy Hot, Bengay, etc. Please stretch, apply heat, and have massage therapy for additional assistance. ° °

## 2020-04-22 NOTE — ED Provider Notes (Signed)
[facility] EMERGENCY DEPARTMENT Provider Note  CSN: [id number] Arrival date & time: 04/21/20 1144  Chief Complaint(s) Chest Pain and Headache  HPI Kristy Campbell is a 34 y.o. female with a past medical history listed below who presents to the emergency department with 1 day of left-sided headache, neck pain, back pain and chest pain described as tightness.  Intensity is moderate to severe.  Worse with range of motion and palpation.  She denies any traumas or falls.  No focal deficits but is endorsing some left arm tingling.  No recent fevers or infections.  No nausea or vomiting.  No abdominal pain.  No other physical complaints.  Patient actually seen recently for similar presentations for the left-sided paresthesias and headache with negative MRIs.     HPI  Past Medical History Past Medical History:  Diagnosis Date  . Anxiety   . Depression   . Gestational diabetes   . Hypertension    Dx 22  . Mental disorder   . Neuromuscular disorder (HCC)   . Seizures [facility])    Patient Active Problem List   Diagnosis Date Noted  . 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 Anxiety 09/01/2013  . Dehydration 08/31/2013  . S/P emergency cesarean section 12/01/2012   Home Medication(s) Prior to Admission medications   Medication Sig Start Date End Date Taking? Authorizing Provider  aspirin-acetaminophen-caffeine (EXCEDRIN MIGRAINE) [phone] MG tablet Take 2 tablets by mouth 2 (two) times daily as needed for headache.    [provider]  B Complex Vitamins (VITAMIN B COMPLEX) TABS Take 1 tablet by mouth daily.    [provider]  butalbital-acetaminophen-caffeine (FIORICET, ESGIC) 50-325-40 MG per tablet Take 1 tablet by mouth 3 (three) times daily as needed for headache (headache).     [provider]  clonazePAM (KLONOPIN) 1 MG tablet Take 1 mg by mouth 2 (two) times daily.    [provider]  diclofenac (VOLTAREN) 50 MG EC tablet Take 50  mg by mouth 3 (three) times daily. 03/19/20   [provider]  Erenumab-aooe (AIMOVIG) 140 MG/ML SOAJ Inject into the skin. 12/10/19   [provider]  escitalopram (LEXAPRO) 20 MG tablet Take 20 mg by mouth daily.     [provider]  ferrous sulfate 325 (65 FE) MG tablet Take 325 mg by mouth daily.    [provider]  ibuprofen (ADVIL,MOTRIN) 400 MG tablet Take 1 tablet (400 mg total) by mouth 3 (three) times daily. Patient taking differently: Take 600 mg by mouth daily as needed (cramping and leg pain). 11/02/15   Azalia Bilis, MD  levETIRAcetam (KEPPRA XR) 500 MG 24 hr tablet Take 2,000 mg by mouth daily. 07/13/16 04/19/20  [provider]  lidocaine (XYLOCAINE) 5 % ointment Apply 1 application topically 3 (three) times daily as needed for mild pain. 02/24/20   [provider]  methocarbamol (ROBAXIN) 500 MG tablet Take 1 tablet (500 mg total) by mouth 2 (two) times daily. Patient not taking: No sig reported 08/26/15   Roxy Horseman, PA-C  ondansetron (ZOFRAN-ODT) 4 MG disintegrating tablet Take 4 mg by mouth 2 (two) times daily as needed for nausea or vomiting. 03/09/20   [provider]  pantoprazole (PROTONIX) 40 MG tablet Take 40 mg by mouth daily.    [provider]  polyethylene glycol powder (GLYCOLAX/MIRALAX) 17 GM/SCOOP powder Take 17 g by mouth daily as needed for mild constipation. 03/19/20   [provider]  prochlorperazine (COMPAZINE) 10 MG tablet  Take 1 tablet (10 mg total) by mouth 2 (two) times daily as needed for nausea or vomiting (headache). 04/16/20   Ward, Layla Maw, DO  propranolol (INDERAL) 80 MG tablet Take 40 mg by mouth 2 (two) times daily.    [provider]  zonisamide (ZONEGRAN) 100 MG capsule Take 200 mg by mouth daily. 04/08/20   [provider]                                                                                                                                     Past Surgical History Past Surgical History:  Procedure Laterality Date  . CESAREAN SECTION N/A 12/01/2012   Procedure: Primary Cesarean Section Delivery Baby Boy  @ [id number],    Apgars 9/9;  Surgeon: Tilda Burrow, MD;  Location: [facility];  Service: Obstetrics;  Laterality: N/A;   Family History No family history on file.  Social History Social History   Tobacco Use  . Smoking status: Never Smoker  . Smokeless tobacco: Never Used  Substance Use Topics  . Alcohol use: No  . Drug use: No   Allergies Triptans, Haldol [haloperidol], Imitrex [sumatriptan], Depakote [divalproex sodium], and Ubrogepant  Review of Systems Review of Systems All other systems are reviewed and are negative for acute change except as noted in the HPI  Physical Exam Vital Signs  I have reviewed the triage vital signs BP (!) 148/86   Pulse 73   Temp 98.6 F (37 C)   Resp 16   LMP 03/27/2020   SpO2 100%   Physical Exam Vitals reviewed.  Constitutional:      General: She is not in acute distress.    Appearance: She is well-developed and well-nourished. She is not diaphoretic.  HENT:     Head: Normocephalic and atraumatic.     Nose: Nose normal.  Eyes:     General: No scleral icterus.       Right eye: No discharge.        Left eye: No discharge.     Extraocular Movements: EOM normal.     Conjunctiva/sclera: Conjunctivae normal.     Pupils: Pupils are equal, round, and reactive to light.  Cardiovascular:     Rate and Rhythm: Normal rate and regular rhythm.     Heart sounds: No murmur heard. No friction rub. No gallop.   Pulmonary:     Effort: Pulmonary effort is normal. No respiratory distress.     Breath sounds: Normal breath sounds. No stridor. No rales.  Chest:     Chest wall: Tenderness present.    Abdominal:     General: There is no distension.     Palpations: Abdomen is soft.     Tenderness: There is no abdominal tenderness.  Musculoskeletal:        General: No edema.      Cervical back: Normal range of motion and neck supple. Spasms and  tenderness present. No bony tenderness.       Back:  Skin:    General: Skin is warm and dry.     Findings: No erythema or rash.     Comments: Numerous nevi  Neurological:     Mental Status: She is alert and oriented to person, place, and time.     Comments: Mental Status:  Alert and oriented to person, place, and time.  Attention and concentration normal.  Speech clear.  Recent memory is intact  Cranial Nerves:  II Visual Fields: Intact to confrontation. Visual fields intact. III, IV, VI: Pupils equal and reactive to light and near. Full eye movement without nystagmus  V Facial Sensation: Normal. No weakness of masticatory muscles  VII: No facial weakness or asymmetry  VIII Auditory Acuity: Grossly normal  IX/X: The uvula is midline; the palate elevates symmetrically  XI: Normal sternocleidomastoid and trapezius strength  XII: The tongue is midline. No atrophy or fasciculations.   Motor System: Muscle Strength: 5/5 and symmetric in the upper and lower extremities. No pronation or drift.  Muscle Tone: Tone and muscle bulk are normal in the upper and lower extremities.   Coordination: No tremor.  Sensation: Intact to light touch..  Gait: Routine gait normal.   Psychiatric:        Mood and Affect: Mood and affect normal.     ED Results and Treatments Labs (all labs ordered are listed, but only abnormal results are displayed) Labs Reviewed  BASIC METABOLIC PANEL - Abnormal; Notable for the following components:      Result Value   CO2 20 (*)    All other components within normal limits  CBC - Abnormal; Notable for the following components:   Hemoglobin 11.1 (*)    HCT 35.5 (*)    All other components within normal limits  I-STAT BETA HCG BLOOD, ED ([facility], [facility], [facility] ONLY)  TROPONIN I (HIGH SENSITIVITY)  TROPONIN I (HIGH SENSITIVITY)                                                                                                                          EKG  EKG Interpretation  Date/Time:  Tuesday April 21 2020 11:45:57 EST Ventricular Rate:  69 PR Interval:  130 QRS Duration: 80 QT Interval:  396 QTC Calculation: 424 R Axis:   67 Text Interpretation: Normal sinus rhythm Normal ECG No acute changes Confirmed by Drema Pryardama, Dottie Vaquerano [phone]([id number]) on 04/22/2020 12:04:24 AM      Radiology DG Chest 2 View  Result Date: 04/21/2020 CLINICAL DATA:  Chest pain and shortness of breath EXAM: CHEST - 2 VIEW COMPARISON:  04/19/2020 FINDINGS: The heart size and mediastinal contours are within normal limits. Both lungs are clear. The visualized skeletal structures are unremarkable. IMPRESSION: No active cardiopulmonary disease. Electronically Signed   By: Alcide CleverMark  Lukens M.D.   On: 04/21/2020 12:29    Pertinent labs & imaging results that were available during my care of the patient were  reviewed by me and considered in my medical decision making (see chart for details).  Medications Ordered in ED Medications  acetaminophen (TYLENOL) tablet 650 mg (650 mg Oral Given 04/21/20 2000)  ketorolac (TORADOL) 15 MG/ML injection 15 mg (15 mg Intravenous Given 04/22/20 0040)  prochlorperazine (COMPAZINE) injection 10 mg (10 mg Intravenous Given 04/22/20 0042)  diphenhydrAMINE (BENADRYL) injection 12.5 mg (12.5 mg Intravenous Given 04/22/20 0038)                                                                                                                                    Procedures Procedures  (including critical care time)  Medical Decision Making / ED Course I have reviewed the nursing notes for this encounter and the patient's prior records (if available in EHR or on provided paperwork).   Kristy Campbell was evaluated in Emergency Department on 04/22/2020 for the symptoms described in the history of present illness. She was evaluated in the context of the global COVID-19 pandemic, which necessitated consideration  that the patient might be at risk for infection with the SARS-CoV-2 virus that causes COVID-19. Institutional protocols and algorithms that pertain to the evaluation of patients at risk for COVID-19 are in a state of rapid change based on information released by regulatory bodies including the CDC and federal and state organizations. These policies and algorithms were followed during the patient's care in the ED.  Patient's presentation is likely related to muscle strain/spasm of the upper back/neck musculature.  EKG without acute ischemic changes.  Troponins done in triage were negative x2.  Highly inconsistent with ACS.  No need for further evaluation.  Low suspicion for pulmonary embolism.  Presentation not classic for aortic dissection or esophageal perforation.  Chest x-ray without evidence suggestive of pneumonia, pneumothorax, pneumomediastinum.  No abnormal contour of the mediastinum to suggest dissection. No evidence of acute injuries.  Non focal neuro exam. No recent head trauma. No fever. Doubt meningitis. Doubt intracranial bleed. Doubt IIH. No indication for repeat imaging.   Will treat with migraine cocktail and reevaluate.  1:03 AM Report feeling better.      Final Clinical Impression(s) / ED Diagnoses Final diagnoses:  Muscle strain of upper back   The patient appears reasonably screened and/or stabilized for discharge and I doubt any other medical condition or other [facility] requiring further screening, evaluation, or treatment in the ED at this time prior to discharge. Safe for discharge with strict return precautions.  Disposition: Discharge  Condition: Good  I have discussed the results, Dx and Tx plan with the patient/family who expressed understanding and agree(s) with the plan. Discharge instructions discussed at length. The patient/family was given strict return precautions who verbalized understanding of the instructions. No further questions at time of discharge.     ED Discharge Orders    None       Follow Up: Jearld Lesch, MD [address] [place]  [address] [phone]  Call  To schedule an appointment for close follow up      This chart was dictated using voice recognition software.  Despite best efforts to proofread,  errors can occur which can change the documentation meaning.   Nira Conn, MD 04/22/20 [phone]

## 2020-07-11 ENCOUNTER — Encounter (HOSPITAL_COMMUNITY): Payer: Self-pay

## 2020-07-11 ENCOUNTER — Emergency Department (HOSPITAL_COMMUNITY)
Admission: EM | Admit: 2020-07-11 | Discharge: 2020-07-12 | Disposition: A | Discharge status: Home / Self Care | Payer: Medicaid Other | Attending: Emergency Medicine | Admitting: Emergency Medicine

## 2020-07-11 ENCOUNTER — Emergency Department (HOSPITAL_COMMUNITY): Payer: Medicaid Other

## 2020-07-11 ENCOUNTER — Other Ambulatory Visit: Payer: Self-pay

## 2020-07-11 DIAGNOSIS — K921 Melena: Secondary | ICD-10-CM | POA: Insufficient documentation

## 2020-07-11 DIAGNOSIS — I10 Essential (primary) hypertension: Secondary | ICD-10-CM | POA: Diagnosis not present

## 2020-07-11 DIAGNOSIS — R109 Unspecified abdominal pain: Secondary | ICD-10-CM

## 2020-07-11 DIAGNOSIS — Z79899 Other long term (current) drug therapy: Secondary | ICD-10-CM | POA: Insufficient documentation

## 2020-07-11 LAB — CBC
HCT: 32.4 % — ABNORMAL LOW (ref 36.0–46.0)
Hemoglobin: 10.1 g/dL — ABNORMAL LOW (ref 12.0–15.0)
MCH: 26.5 pg (ref 26.0–34.0)
MCHC: 31.2 g/dL (ref 30.0–36.0)
MCV: 85 fL (ref 80.0–100.0)
Platelets: 369 10*3/uL (ref 150–400)
RBC: 3.81 MIL/uL — ABNORMAL LOW (ref 3.87–5.11)
RDW: 13.5 % (ref 11.5–15.5)
WBC: 4.7 10*3/uL (ref 4.0–10.5)
nRBC: 0 % (ref 0.0–0.2)

## 2020-07-11 LAB — COMPREHENSIVE METABOLIC PANEL
ALT: 12 U/L (ref 0–44)
AST: 20 U/L (ref 15–41)
Albumin: 3.9 g/dL (ref 3.5–5.0)
Alkaline Phosphatase: 53 U/L (ref 38–126)
Anion gap: 5 (ref 5–15)
BUN: 10 mg/dL (ref 6–20)
CO2: 20 mmol/L — ABNORMAL LOW (ref 22–32)
Calcium: 8.6 mg/dL — ABNORMAL LOW (ref 8.9–10.3)
Chloride: 111 mmol/L (ref 98–111)
Creatinine, Ser: 0.82 mg/dL (ref 0.44–1.00)
GFR, Estimated: >60 mL/min (ref >60)
Glucose, Bld: 96 mg/dL (ref 70–99)
Potassium: 4.9 mmol/L (ref 3.5–5.1)
Sodium: 136 mmol/L (ref 135–145)
Total Bilirubin: 0.4 mg/dL (ref 0.3–1.2)
Total Protein: 7 g/dL (ref 6.5–8.1)

## 2020-07-11 LAB — I-STAT BETA HCG BLOOD, ED (MC, WL, AP ONLY): I-stat hCG, quantitative: <5 m[IU]/mL (ref <5)

## 2020-07-11 LAB — POC OCCULT BLOOD, ED: Fecal Occult Bld: NEGATIVE

## 2020-07-11 MED ORDER — IOHEXOL 9 MG/ML PO SOLN
500.0000 mL | ORAL | Status: AC
Start: 2020-07-11 — End: 2020-07-12
  Administered 2020-07-11 – 2020-07-12 (×2): 500 mL via ORAL

## 2020-07-11 MED ORDER — ONDANSETRON 4 MG PO TBDP: 4.0000 mg | ORAL_TABLET | Freq: Three times a day (TID) | ORAL | 0 refills | Status: AC | PRN

## 2020-07-11 MED ORDER — ONDANSETRON HCL 4 MG/2ML IJ SOLN
4.0000 mg | Freq: Once | INTRAMUSCULAR | Status: AC
  Administered 2020-07-11: 4 mg via INTRAVENOUS
  Filled 2020-07-11: qty 2

## 2020-07-11 MED ORDER — IOHEXOL 300 MG/ML  SOLN
100.0000 mL | Freq: Once | INTRAMUSCULAR | Status: AC | PRN
  Administered 2020-07-11: 85 mL via INTRAVENOUS

## 2020-07-11 MED ORDER — FENTANYL CITRATE (PF) 100 MCG/2ML IJ SOLN
50.0000 ug | Freq: Once | INTRAMUSCULAR | Status: AC
Start: 2020-07-11 — End: 2020-07-11
  Administered 2020-07-11: 50 ug via INTRAVENOUS
  Filled 2020-07-11: qty 2

## 2020-07-11 MED ORDER — DICYCLOMINE HCL 20 MG PO TABS: 20.0000 mg | ORAL_TABLET | Freq: Two times a day (BID) | ORAL | 0 refills | Status: AC

## 2020-07-11 MED ORDER — MORPHINE SULFATE (PF) 2 MG/ML IV SOLN
2.0000 mg | Freq: Once | INTRAVENOUS | Status: AC
Start: 2020-07-11 — End: 2020-07-12
  Administered 2020-07-12: 2 mg via INTRAVENOUS
  Filled 2020-07-11: qty 1

## 2020-07-11 MED ORDER — IOHEXOL 9 MG/ML PO SOLN
ORAL | Status: AC
  Filled 2020-07-11: qty 1000

## 2020-07-11 NOTE — ED Provider Notes (Signed)
[facility]-EMERGENCY DEPT Provider Note   CSN: [id number] Arrival date & time: 07/11/20  1751     History Chief Complaint  Patient presents with   Abdominal Pain   Rectal Bleeding    Kristy Campbell is a 35 y.o. female.  HPI 35 year old female with a history of depression, hypertension, mental disorder, neuromuscular disorder, seizures presents to the ER with complaints of bright red and occasional dark red blood per rectum x2 to 3 months. Patient states that she had a hemorrhoidectomy done with Dr. Clance Boll in October, and has had consistent bleeding since then. She states that when she moves her bowels there will be significant amount of blood in the toilet and the toilet paper will also be saturated. She states that she started to develop some abdominal pain a couple days ago, and presented to [facility] ER today. She states that they "did not do anything for her", but cannot exactly recall the conversation that she had with them as she was in a lot of pain at the time. She has not followed up with Dr. Mayford Knife. She denies any dizziness, shortness of breath or weakness. No hematemesis, nausea or vomiting. She reports taking sitz bath's for her hemorrhoids, but no other interventions.    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   Anxiety 09/01/2013   Dehydration 08/31/2013   S/P emergency cesarean section 12/01/2012    Past Surgical History:  Procedure Laterality Date   CESAREAN SECTION N/A 12/01/2012   Procedure: Primary Cesarean Section Delivery Baby Boy  @ 0523,    Apgars 9/9;  Surgeon: Tilda Burrow, MD;  Location: [facility];  Service: Obstetrics;  Laterality: N/A;   HEMORRHOIDECTOMY WITH HEMORRHOID  BANDING       OB History    Gravida  1   Para  1   Term  0   Preterm  1   AB  0   Living  0     SAB  0   IAB  0   Ectopic  0   Multiple  0   Live Births              Family History  Problem Relation Age of Onset   Chronic Renal Failure Mother     Social History   Tobacco Use   Smoking status: Never Smoker   Smokeless tobacco: Never Used  Vaping Use   Vaping Use: Never used  Substance Use Topics   Alcohol use: No   Drug use: No    Home Medications Prior to Admission medications   Medication Sig Start Date End Date Taking? Authorizing Provider  Aspirin-Salicylamide-Caffeine (BC HEADACHE POWDER PO) Take 1 packet by mouth daily as needed (headache/migraines).   Yes [provider]  B Complex Vitamins (VITAMIN B COMPLEX) TABS Take 1 tablet by mouth daily.   Yes [provider]  butalbital-acetaminophen-caffeine (FIORICET, ESGIC) 50-325-40 MG per tablet Take 1 tablet by mouth 3 (three) times daily as needed for headache (headache).    Yes [provider]  clonazePAM (KLONOPIN) 1 MG tablet Take 1 mg by mouth 2 (two) times daily.   Yes [provider]  dicyclomine (  BENTYL) 20 MG tablet Take 1 tablet (20 mg total) by mouth 2 (two) times daily. 07/11/20  Yes Mare Ferrari, PA-C  escitalopram (LEXAPRO) 20 MG tablet Take 20 mg by mouth daily.    Yes [provider]  levETIRAcetam (KEPPRA XR) 500 MG 24 hr tablet Take 1,500 mg by mouth at bedtime.   Yes [provider]  ondansetron (ZOFRAN ODT) 4 MG disintegrating tablet Take 1 tablet (4 mg total) by mouth every 8 (eight) hours as needed for nausea or vomiting. 07/11/20  Yes Mare Ferrari, PA-C  propranolol (INDERAL) 40 MG tablet Take 80 mg by mouth 2 (two) times daily.   Yes [provider]  zonisamide (ZONEGRAN) 100 MG capsule Take 400 mg by mouth at bedtime. 04/08/20  Yes [provider]  aspirin-acetaminophen-caffeine (EXCEDRIN MIGRAINE)  [id number] MG tablet Take 2 tablets by mouth 2 (two) times daily as needed for headache.    [provider]  ibuprofen (ADVIL,MOTRIN) 400 MG tablet Take 1 tablet (400 mg total) by mouth 3 (three) times daily. Patient not taking: No sig reported 11/02/15   Azalia Bilis, MD  methocarbamol (ROBAXIN) 500 MG tablet Take 1 tablet (500 mg total) by mouth 2 (two) times daily. Patient not taking: No sig reported 08/26/15   Roxy Horseman, PA-C  polyethylene glycol powder (GLYCOLAX/MIRALAX) 17 GM/SCOOP powder Take 17 g by mouth daily as needed for mild constipation. 03/19/20   [provider]  prochlorperazine (COMPAZINE) 10 MG tablet Take 1 tablet (10 mg total) by mouth 2 (two) times daily as needed for nausea or vomiting (headache). Patient not taking: Reported on 07/11/2020 04/16/20   Ward, Layla Maw, DO    Allergies    Triptans, Haldol [haloperidol], Imitrex [sumatriptan], Depakote [divalproex sodium], and Ubrogepant  Review of Systems   Review of Systems  Constitutional: Negative for chills and fever.  HENT: Negative for ear pain and sore throat.   Eyes: Negative for pain and visual disturbance.  Respiratory: Negative for cough and shortness of breath.   Cardiovascular: Negative for chest pain and palpitations.  Gastrointestinal: Positive for abdominal pain, anal bleeding and blood in stool. Negative for vomiting.  Genitourinary: Negative for dysuria and hematuria.  Musculoskeletal: Negative for arthralgias and back pain.  Skin: Negative for color change and rash.  Neurological: Negative for seizures and syncope.  All other systems reviewed and are negative.   Physical Exam Updated Vital Signs BP (!) 124/100 (BP Location: Left Arm)    Pulse 76    Temp 99 F (37.2 C) (Oral)    Resp 16    Ht 4\' 11"  (1.499 m)    Wt 43.1 kg    LMP 07/01/2020 (Approximate)    SpO2 100%    BMI 19.19 kg/m   Physical Exam Vitals and nursing note reviewed.  Constitutional:      General: She is  not in acute distress.    Appearance: She is well-developed and well-nourished.  HENT:     Head: Normocephalic and atraumatic.  Eyes:     Conjunctiva/sclera: Conjunctivae normal.  Cardiovascular:     Rate and Rhythm: Normal rate and regular rhythm.     Heart sounds: No murmur heard.   Pulmonary:     Effort: Pulmonary effort is normal. No respiratory distress.     Breath sounds: Normal breath sounds.  Abdominal:     Palpations: Abdomen is soft.     Tenderness: There is no abdominal tenderness.     Comments: Mild nonfocal lower  abdominal tenderness on exam. No flank tenderness.  Genitourinary:    Rectum: Normal. Guaiac result negative.     Comments: Rectal exam performed with RN at bedside. No frank red blood on exam. No evidence of dark tarry stools. Stool is brownish in color. Large external hemorrhoids noted around the rectum. No evidence of thrombosed hemorrhoids. Musculoskeletal:        General: No edema.     Cervical back: Neck supple.  Skin:    General: Skin is warm and dry.  Neurological:     Mental Status: She is alert.  Psychiatric:        Mood and Affect: Mood and affect normal.     ED Results / Procedures / Treatments   Labs (all labs ordered are listed, but only abnormal results are displayed) Labs Reviewed  COMPREHENSIVE METABOLIC PANEL - Abnormal; Notable for the following components:      Result Value   CO2 20 (*)    Calcium 8.6 (*)    All other components within normal limits  CBC - Abnormal; Notable for the following components:   RBC 3.81 (*)    Hemoglobin 10.1 (*)    HCT 32.4 (*)    All other components within normal limits  POC OCCULT BLOOD, ED  I-STAT BETA HCG BLOOD, ED ([facility], [facility], [facility] ONLY)  TYPE AND SCREEN    EKG None  Radiology CT ABDOMEN PELVIS W CONTRAST  Result Date: 07/11/2020 CLINICAL DATA:  GI bleed. EXAM: CT ABDOMEN AND PELVIS WITH CONTRAST TECHNIQUE: Multidetector CT imaging of the abdomen and pelvis was performed using the standard  protocol following bolus administration of intravenous contrast. CONTRAST:  85mL OMNIPAQUE IOHEXOL 300 MG/ML  SOLN COMPARISON:  Most recent abdominal CT 03/07/2020 FINDINGS: Lower chest: The lung bases are clear. No focal airspace disease or pleural effusion. Hepatobiliary: Minimal focal fatty infiltration adjacent to the falciform ligament. No focal hepatic lesion. Gallbladder physiologically distended, no calcified stone. No biliary dilatation. Pancreas: No ductal dilatation or inflammation. Spleen: Normal in size without focal abnormality. Adrenals/Urinary Tract: Normal adrenal glands. No hydronephrosis or perinephric edema. Homogeneous renal enhancement. Urinary bladder is physiologically distended without wall thickening. Stomach/Bowel: Stomach is unremarkable. Normal small bowel without obstruction, inflammation, or wall thickening. Cecum is located in the midline pelvis. No terminal ileal inflammation. Appendix not visualized. No appendicitis. Moderate volume of colonic stool. No colonic wall thickening or pericolonic edema. No abnormal rectal distention. Vascular/Lymphatic: No significant vascular findings are present. No enlarged abdominal or pelvic lymph nodes. Reproductive: Slightly heterogeneous appearance of the uterine fundus, no evidence of fibroid or focal mass. Ovaries appear symmetric. No adnexal mass. Previous cyst in the right adnexa is no longer seen. Trace pelvic free fluid. Other: No upper abdominal ascites. No free air or focal fluid collection. No abdominal wall hernia Musculoskeletal: There are no acute or suspicious osseous abnormalities. Stable marginated tubular soft tissue density in the left paraspinal musculature at the level of L3, of doubtful clinical significance. IMPRESSION: No acute abnormality in the abdomen/pelvis. No explanation for GI bleed. Electronically Signed   By: Narda RutherfordMelanie  Sanford M.D.   On: 07/11/2020 23:27    Procedures Procedures   Medications Ordered in  ED Medications  iohexol (OMNIPAQUE) 9 MG/ML oral solution 500 mL (500 mLs Oral Contrast Given 07/11/20 2054)  iohexol (OMNIPAQUE) 9 MG/ML oral solution (has no administration in time range)  morphine 2 MG/ML injection 2 mg (has no administration in time range)  fentaNYL (SUBLIMAZE) injection 50 mcg (50 mcg Intravenous  Given 07/11/20 2101)  iohexol (OMNIPAQUE) 300 MG/ML solution 100 mL (85 mLs Intravenous Contrast Given 07/11/20 2300)  ondansetron (ZOFRAN) injection 4 mg (4 mg Intravenous Given 07/11/20 2158)    ED Course  I have reviewed the triage vital signs and the nursing notes.  Pertinent labs & imaging results that were available during my care of the patient were reviewed by me and considered in my medical decision making (see chart for details).  Clinical Course as of 07/11/20 2351  Sat Jul 11, 2020  1378 35 year old female who complains of ductal bleeding since October. History of hemorrhoids. On arrival, she is well-appearing, no acute distress, resting comfortably in the ER bed. Vitals on arrival overall reassuring. Physical exam without any evidence of bright red blood per rectum, she does have visible external hemorrhoids on exam. She complains of some mild lower abdominal pain and has mild nonfocal tenderness on exam. She was reportedly seen at [facility] earlier and they "did not do anything".  Lab work ordered in triage, overall reassuring. Hemoglobin of 10.1 which does appear slightly decreased from 2 months ago when it was 11.1. She denies any symptomatic anemia however. CMP largely unremarkable. Fecal occult blood is negative here.  Given complaints of abdominal pain and mild lower abdominal tenderness, will proceed with CT scan for further evaluation. Suspect she will be stable for discharge with follow-up with forward with Dr. Ulice Brilliant. Patient provided fentanyl for analgesia. [MB]  2103 Hemoglobin(!): 10.1 Slightly decreased from 2 months ago [MB]  2333 CT ABDOMEN  PELVIS W CONTRAST IMPRESSION: No acute abnormality in the abdomen/pelvis. No explanation for GI bleed. [MB]  2333 Overall work-up reassuring.  Patient stable for follow-up with her general surgeon and her GI doctor in [place].  Patient requesting additional pain medicine, will give her a dose of morphine here, will prescribe Zofran and Bentyl per her request.  Return precautions discussed.  Stable for discharge. [MB]    Clinical Course User Index [MB] Leone Brand   MDM Rules/Calculators/A&P                           Final Clinical Impression(s) / ED Diagnoses Final diagnoses:  Hematochezia  Abdominal pain, unspecified abdominal location    Rx / DC Orders ED Discharge Orders         Ordered    ondansetron (ZOFRAN ODT) 4 MG disintegrating tablet  Every 8 hours PRN        07/11/20 2338    dicyclomine (BENTYL) 20 MG tablet  2 times daily        07/11/20 2338           Leone Brand 07/11/20 2352    Gerhard Munch, MD 07/12/20 [phone]

## 2020-07-11 NOTE — ED Notes (Signed)
Patient transported to CT 

## 2020-07-11 NOTE — Discharge Instructions (Addendum)
Your CT scan today was reassuring, and your stool did not have any blood in it.  Please follow-up with your general surgeon Dr. Cliffton Asters and also your GI doctor.  Please take the Zofran and Bentyl as needed.  I have provided their contact information for discharge paperwork.  Return to the ER if you develop dizziness, weakness, shortness of breath, or any other new or worsening symptoms.

## 2020-07-11 NOTE — ED Triage Notes (Signed)
Per EMS- Patient reports that she has a history of hemorrhoids. Patient c/o having dark red rectal bleeding x 2-3 days. Patient went to [facility] today and reported to EMS that they told her to get a second opinion.  Patient received NS 650 ml prior to arrival to the ED. Patient told EMS that she never drinks water because she does not like water

## 2020-07-12 LAB — TYPE AND SCREEN
ABO/RH(D): AB POS
Antibody Screen: NEGATIVE

## 2021-07-15 ENCOUNTER — Encounter (HOSPITAL_COMMUNITY): Payer: Self-pay | Admitting: Emergency Medicine

## 2021-07-15 ENCOUNTER — Emergency Department (HOSPITAL_COMMUNITY)
Admission: EM | Admit: 2021-07-15 | Discharge: 2021-07-16 | Disposition: A | Discharge status: Home / Self Care | Payer: Medicaid Other | Attending: Emergency Medicine | Admitting: Emergency Medicine

## 2021-07-15 DIAGNOSIS — Z79899 Other long term (current) drug therapy: Secondary | ICD-10-CM | POA: Diagnosis not present

## 2021-07-15 DIAGNOSIS — F332 Major depressive disorder, recurrent severe without psychotic features: Secondary | ICD-10-CM | POA: Insufficient documentation

## 2021-07-15 DIAGNOSIS — I10 Essential (primary) hypertension: Secondary | ICD-10-CM | POA: Insufficient documentation

## 2021-07-15 DIAGNOSIS — R519 Headache, unspecified: Secondary | ICD-10-CM | POA: Insufficient documentation

## 2021-07-15 DIAGNOSIS — R45851 Suicidal ideations: Secondary | ICD-10-CM | POA: Diagnosis not present

## 2021-07-15 DIAGNOSIS — R0781 Pleurodynia: Secondary | ICD-10-CM | POA: Diagnosis not present

## 2021-07-15 DIAGNOSIS — F4322 Adjustment disorder with anxiety: Secondary | ICD-10-CM | POA: Insufficient documentation

## 2021-07-15 DIAGNOSIS — Z20822 Contact with and (suspected) exposure to covid-19: Secondary | ICD-10-CM | POA: Insufficient documentation

## 2021-07-15 DIAGNOSIS — Z7982 Long term (current) use of aspirin: Secondary | ICD-10-CM | POA: Insufficient documentation

## 2021-07-15 DIAGNOSIS — F4310 Post-traumatic stress disorder, unspecified: Secondary | ICD-10-CM | POA: Insufficient documentation

## 2021-07-15 DIAGNOSIS — F32A Depression, unspecified: Secondary | ICD-10-CM

## 2021-07-15 LAB — CBC WITH DIFFERENTIAL/PLATELET
Abs Immature Granulocytes: 0.02 10*3/uL (ref 0.00–0.07)
Basophils Absolute: 0.1 10*3/uL (ref 0.0–0.1)
Basophils Relative: 1 %
Eosinophils Absolute: 0.1 10*3/uL (ref 0.0–0.5)
Eosinophils Relative: 2 %
HCT: 38.3 % (ref 36.0–46.0)
Hemoglobin: 12.1 g/dL (ref 12.0–15.0)
Immature Granulocytes: 0 %
Lymphocytes Relative: 33 %
Lymphs Abs: 2.7 10*3/uL (ref 0.7–4.0)
MCH: 25.5 pg — ABNORMAL LOW (ref 26.0–34.0)
MCHC: 31.6 g/dL (ref 30.0–36.0)
MCV: 80.6 fL (ref 80.0–100.0)
Monocytes Absolute: 0.9 10*3/uL (ref 0.1–1.0)
Monocytes Relative: 11 %
Neutro Abs: 4.4 10*3/uL (ref 1.7–7.7)
Neutrophils Relative %: 53 %
Platelets: 487 10*3/uL — ABNORMAL HIGH (ref 150–400)
RBC: 4.75 MIL/uL (ref 3.87–5.11)
RDW: 15.1 % (ref 11.5–15.5)
WBC: 8.3 10*3/uL (ref 4.0–10.5)
nRBC: 0 % (ref 0.0–0.2)

## 2021-07-15 LAB — COMPREHENSIVE METABOLIC PANEL
ALT: 11 U/L (ref 0–44)
AST: 13 U/L — ABNORMAL LOW (ref 15–41)
Albumin: 4.1 g/dL (ref 3.5–5.0)
Alkaline Phosphatase: 59 U/L (ref 38–126)
Anion gap: 9 (ref 5–15)
BUN: 16 mg/dL (ref 6–20)
CO2: 18 mmol/L — ABNORMAL LOW (ref 22–32)
Calcium: 9.1 mg/dL (ref 8.9–10.3)
Chloride: 110 mmol/L (ref 98–111)
Creatinine, Ser: 1.04 mg/dL — ABNORMAL HIGH (ref 0.44–1.00)
GFR, Estimated: >60 mL/min (ref >60)
Glucose, Bld: 117 mg/dL — ABNORMAL HIGH (ref 70–99)
Potassium: 3.4 mmol/L — ABNORMAL LOW (ref 3.5–5.1)
Sodium: 137 mmol/L (ref 135–145)
Total Bilirubin: 0.5 mg/dL (ref 0.3–1.2)
Total Protein: 7.3 g/dL (ref 6.5–8.1)

## 2021-07-15 LAB — RAPID URINE DRUG SCREEN, HOSP PERFORMED
Amphetamines: NOT DETECTED
Barbiturates: POSITIVE — AB
Benzodiazepines: POSITIVE — AB
Cocaine: NOT DETECTED
Opiates: POSITIVE — AB
Tetrahydrocannabinol: NOT DETECTED

## 2021-07-15 LAB — RESP PANEL BY RT-PCR (FLU A&B, COVID) ARPGX2
Influenza A by PCR: NEGATIVE
Influenza B by PCR: NEGATIVE
SARS Coronavirus 2 by RT PCR: NEGATIVE

## 2021-07-15 LAB — I-STAT BETA HCG BLOOD, ED (MC, WL, AP ONLY): I-stat hCG, quantitative: <5 m[IU]/mL (ref <5)

## 2021-07-15 LAB — ETHANOL: Alcohol, Ethyl (B): <10 mg/dL (ref <10)

## 2021-07-15 MED ORDER — ONDANSETRON 4 MG PO TBDP
4.0000 mg | ORAL_TABLET | Freq: Three times a day (TID) | ORAL | Status: DC | PRN
  Administered 2021-07-15 – 2021-07-16 (×2): 4 mg via ORAL
  Filled 2021-07-15 (×2): qty 1

## 2021-07-15 MED ORDER — ESCITALOPRAM OXALATE 10 MG PO TABS
20.0000 mg | ORAL_TABLET | Freq: Every day | ORAL | Status: DC
  Administered 2021-07-15 – 2021-07-16 (×2): 20 mg via ORAL
  Filled 2021-07-15 (×2): qty 2

## 2021-07-15 MED ORDER — ASPIRIN-ACETAMINOPHEN-CAFFEINE 250-250-65 MG PO TABS
2.0000 | ORAL_TABLET | Freq: Two times a day (BID) | ORAL | Status: DC | PRN
  Administered 2021-07-16: 2 via ORAL
  Filled 2021-07-15 (×2): qty 2

## 2021-07-15 MED ORDER — LEVETIRACETAM ER 500 MG PO TB24
1500.0000 mg | ORAL_TABLET | Freq: Every day | ORAL | Status: DC
  Administered 2021-07-15: 23:00:00 1500 mg via ORAL
  Filled 2021-07-15 (×2): qty 3

## 2021-07-15 MED ORDER — PROPRANOLOL HCL 80 MG PO TABS
80.0000 mg | ORAL_TABLET | Freq: Two times a day (BID) | ORAL | Status: DC
  Administered 2021-07-15 – 2021-07-16 (×2): 80 mg via ORAL
  Filled 2021-07-15 (×3): qty 1

## 2021-07-15 MED ORDER — CLONAZEPAM 0.5 MG PO TABS
1.0000 mg | ORAL_TABLET | Freq: Two times a day (BID) | ORAL | Status: DC
  Administered 2021-07-15 – 2021-07-16 (×2): 1 mg via ORAL
  Filled 2021-07-15 (×2): qty 2
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
## 2021-07-15 NOTE — Discharge Instructions (Addendum)
We recommend further evaluation and management through our Behavioral Health Urgent Care center for further management of your condition.  ?
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
## 2021-07-16 ENCOUNTER — Ambulatory Visit (HOSPITAL_COMMUNITY)
Admission: EM | Admit: 2021-07-16 | Discharge: 2021-07-16 | Disposition: A | Discharge status: Home / Self Care | Payer: Medicaid Other | Source: Home / Self Care

## 2021-07-16 DIAGNOSIS — I10 Essential (primary) hypertension: Secondary | ICD-10-CM | POA: Insufficient documentation

## 2021-07-16 DIAGNOSIS — Z79899 Other long term (current) drug therapy: Secondary | ICD-10-CM | POA: Insufficient documentation

## 2021-07-16 DIAGNOSIS — F4322 Adjustment disorder with anxiety: Secondary | ICD-10-CM | POA: Insufficient documentation

## 2021-07-16 DIAGNOSIS — R45851 Suicidal ideations: Secondary | ICD-10-CM | POA: Insufficient documentation

## 2021-07-16 MED ORDER — ESCITALOPRAM OXALATE 20 MG PO TABS
20.0000 mg | ORAL_TABLET | Freq: Every day | ORAL | 0 refills | Status: AC
Start: 2021-07-16 — End: ?

## 2021-07-16 NOTE — Discharge Instructions (Signed)

## 2021-07-16 NOTE — ED Notes (Signed)
Pt given copy of AVS and follow up instructions.  her mother is in route to pick her up.  She states that she has bed at [facility] crisis in [place].  No further distress noted. She has No questions at this time.  ?

## 2021-07-16 NOTE — ED Notes (Addendum)
Labeled belongings found in locker in purple, w/o documentation. Given to [place] with pt. [facility] on the way.  ?

## 2021-07-16 NOTE — ED Provider Notes (Signed)
Pt accepted to transfer/transport to [facility] for further behavioral health treatment.  ? ?Vitals stable, no acute distress. Pt currently appears stable for transport.  ? ? ?  ?Lajean Saver, MD ?07/16/21 [phone] ? ?
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
## 2021-07-16 NOTE — ED Notes (Signed)
Message left with [facility], transport initiated, pending call back.  ?

## 2021-07-16 NOTE — ED Notes (Signed)
Pt given AVS and discharge information.  Awaiting her ride home.  ?

## 2021-08-03 ENCOUNTER — Telehealth (HOSPITAL_COMMUNITY): Payer: Self-pay

## 2021-08-03 NOTE — BH Assessment (Signed)
Care Management - [facility] Follow Up Discharges  ? ?Writer attempted to make contact with patient today and was unsuccessful.  Writer left a HIPPA compliant voice message.  ? ?Per chart revierw,  patient receives her psychiatric medication management from her Neurologist ?

## 2021-11-05 IMAGING — CT CT HEAD W/O CM
4 series · 16 of 47 positions shown, 18 images · non-contrast
Comparison: CT head 04/16/2020, MRI cervical spine 11/19/2012

CLINICAL DATA: Fall, head injury, generalized weakness, peripheral
numbness/paresthesia

EXAM:
CT HEAD WITHOUT CONTRAST
CT CERVICAL SPINE WITHOUT CONTRAST
TECHNIQUE: Multidetector CT imaging of the head and cervical spine was
performed following the standard protocol without intravenous
contrast. Multiplanar CT image reconstructions of the cervical spine
were also generated.

[Series 3: head wo · axial · 0.44mm/px · z∈[+1159,+1279]mm · 7 of 34 slices shown, 9 images]
[im 5/34  brain]
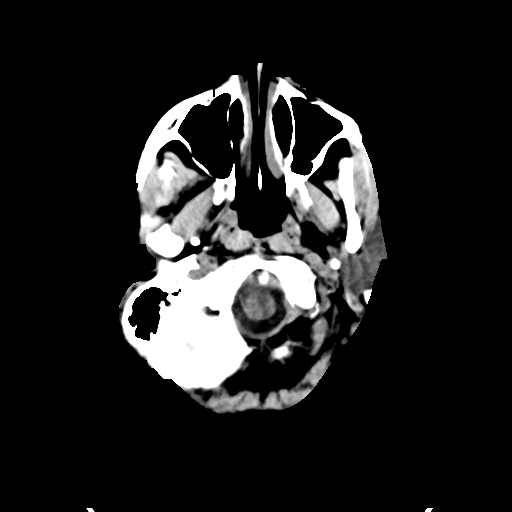
[im 5/34  bone]
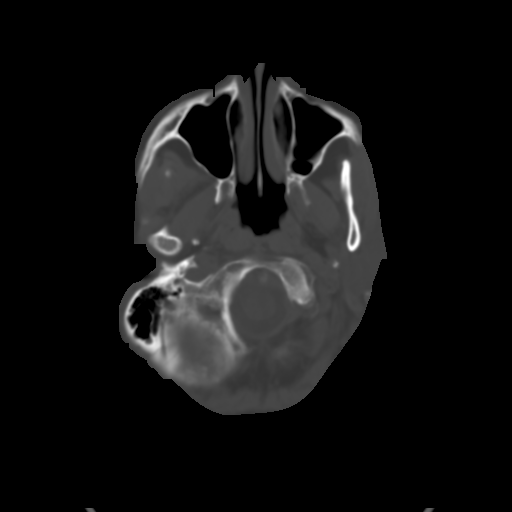
[im 9/34  brain]
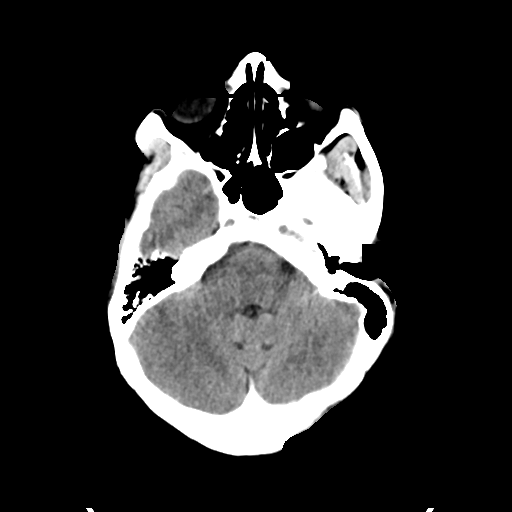
[im 13/34  brain]
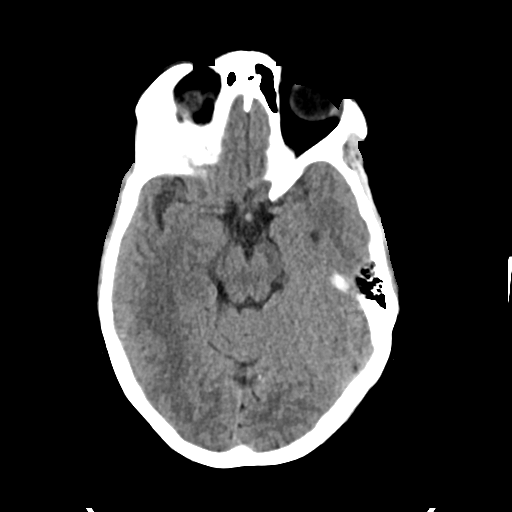
[im 17/34  brain]
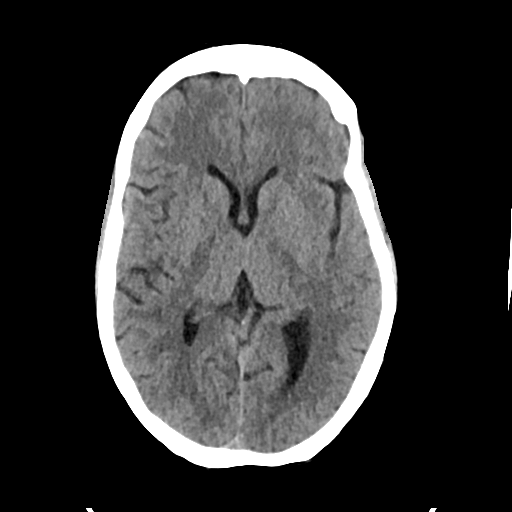
[im 21/34  brain]
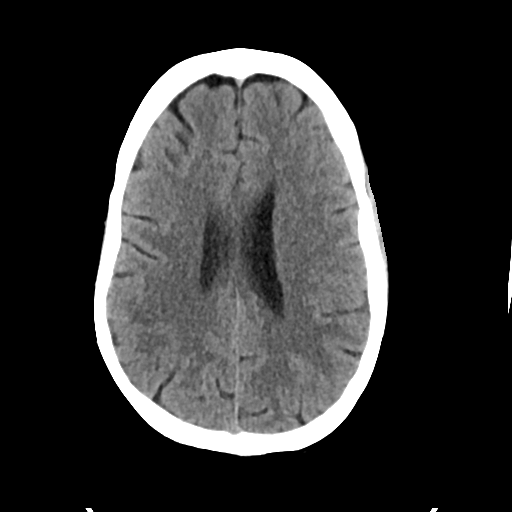
[im 21/34  bone]
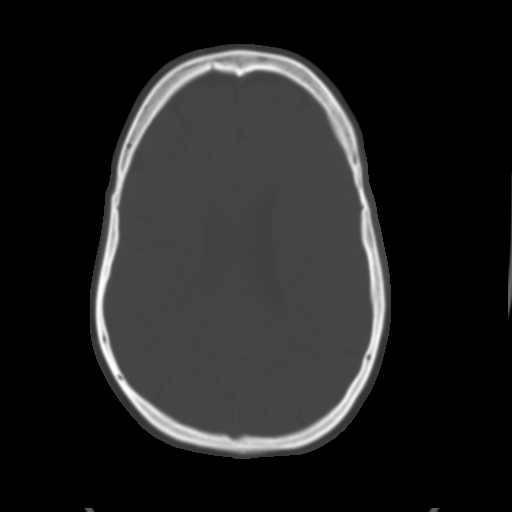
[im 25/34  brain]
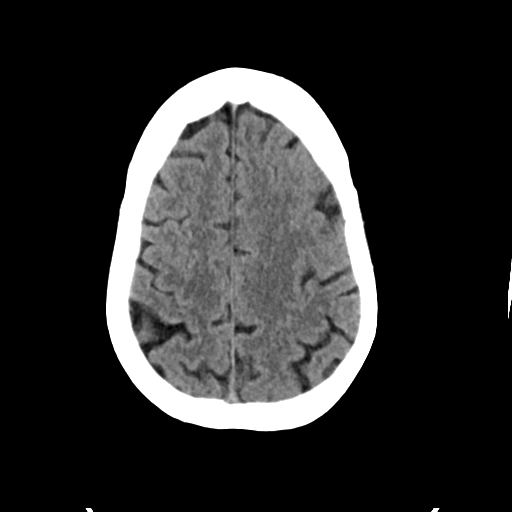
[im 29/34  brain]
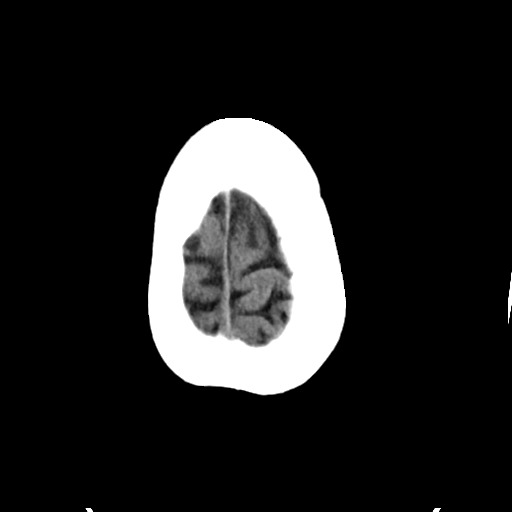

[Series 4: head bone · axial · 0.44mm/px · z∈[+1155,+1187]mm · 3 of 84 slices shown]
[im 9/84  bone]
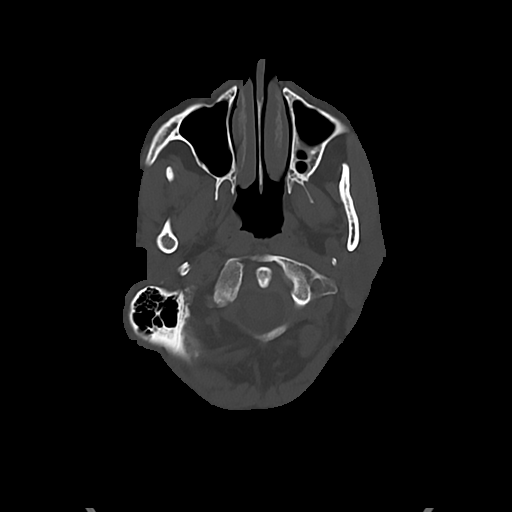
[im 17/84  bone]
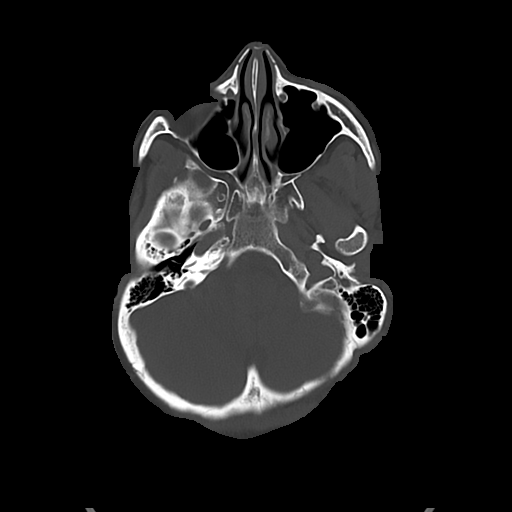
[im 25/84  bone]
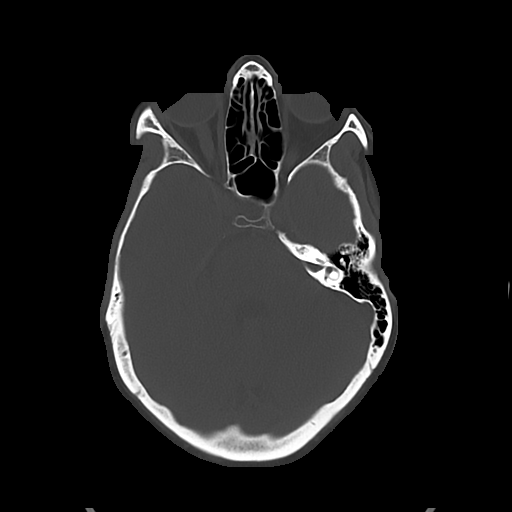

[Series 5: cor soft · coronal · 0.32mm/px · 3 of 70 slices shown]
[im 24/70  brain]
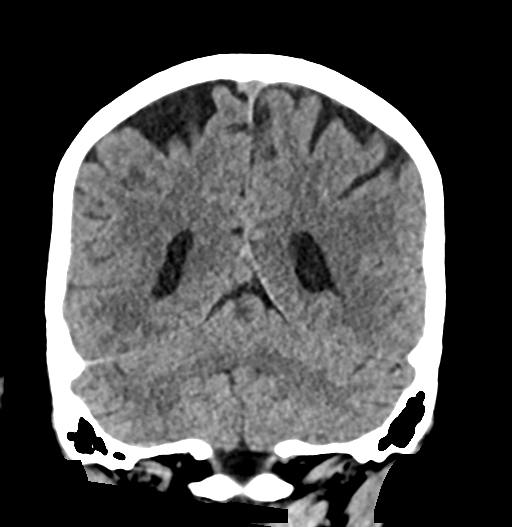
[im 31/70  brain]
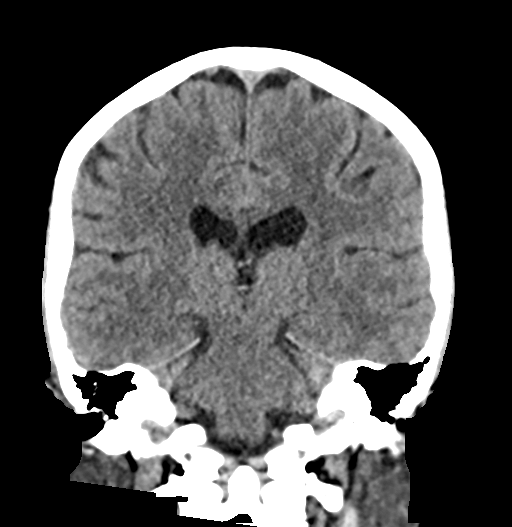
[im 39/70  brain]
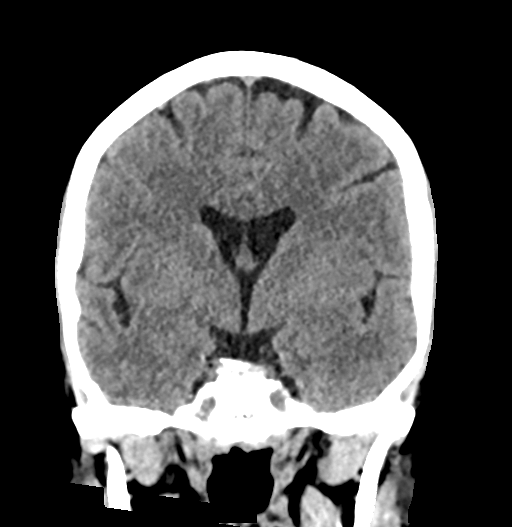

[Series 6: sag soft · sagittal · 0.34mm/px · 3 of 55 slices shown]
[im 20/55  brain]
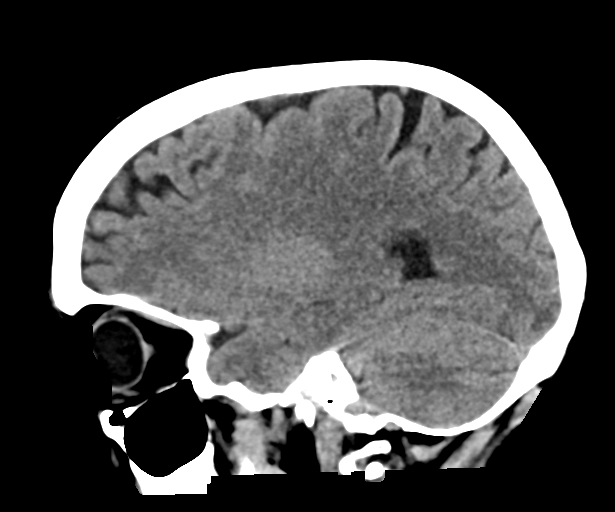
[im 28/55  brain]
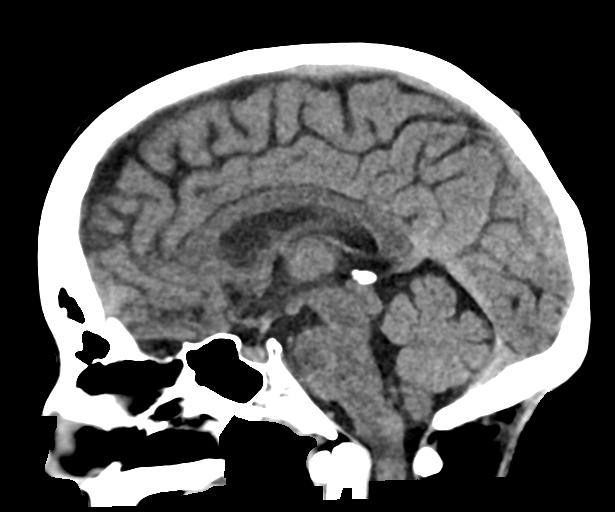
[im 35/55  brain]
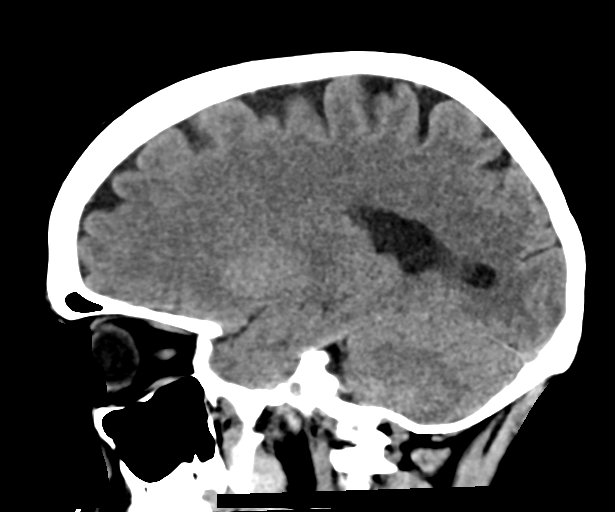

[16 of 47 positions shown; findings below may reference images not displayed]

FINDINGS: CT HEAD FINDINGS

Brain: Normal anatomic configuration. No abnormal intra or
extra-axial mass lesion or fluid collection. No abnormal mass effect
or midline shift. No evidence of acute intracranial hemorrhage or
infarct. Ventricular size is normal. Cerebellum unremarkable.

Vascular: Unremarkable

Skull: Intact

Sinuses/Orbits: Paranasal sinuses are clear. Orbits are
unremarkable.

Other: Mastoid air cells and middle ear cavities are clear.

CT CERVICAL SPINE FINDINGS

Alignment: Normal.  No listhesis.

Skull base and vertebrae: The craniocervical junction is
unremarkable. The atlantodental interval is normal. No acute
fracture of the cervical spine. No lytic or blastic bone lesion

Soft tissues and spinal canal: No prevertebral fluid or swelling. No
visible canal hematoma.

Disc levels: Sagittal reformats demonstrates preservation of normal
cervical lordosis. Vertebral body height and intervertebral disc
heights have been preserved. The spinal canal is widely patent. The
prevertebral soft tissues are not thickened. Review of the axial
images demonstrates no significant uncovertebral or facet arthrosis.
No significant neural foraminal narrowing or canal stenosis.

Upper chest: Unremarkable

Other: None significant
IMPRESSION: Normal examination. No acute intracranial injury. No calvarial
fracture. No acute fracture or listhesis of the cervical spine.

## 2021-11-05 IMAGING — CT CT CERVICAL SPINE W/O CM
3 of 4 series · 12 of 33 positions shown, 14 images · non-contrast
Comparison: CT head 04/16/2020, MRI cervical spine 11/19/2012

CLINICAL DATA: Fall, head injury, generalized weakness, peripheral
numbness/paresthesia

EXAM:
CT HEAD WITHOUT CONTRAST
CT CERVICAL SPINE WITHOUT CONTRAST
TECHNIQUE: Multidetector CT imaging of the head and cervical spine was
performed following the standard protocol without intravenous
contrast. Multiplanar CT image reconstructions of the cervical spine
were also generated.

[Series 7: sag bone · sagittal · 0.21mm/px · 5 of 62 slices shown, 6 images]
[im 21/62  bone]
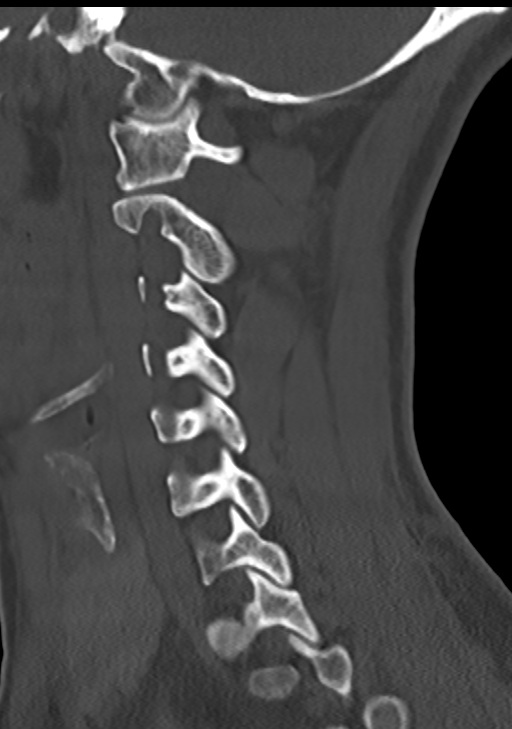
[im 26/62  bone]
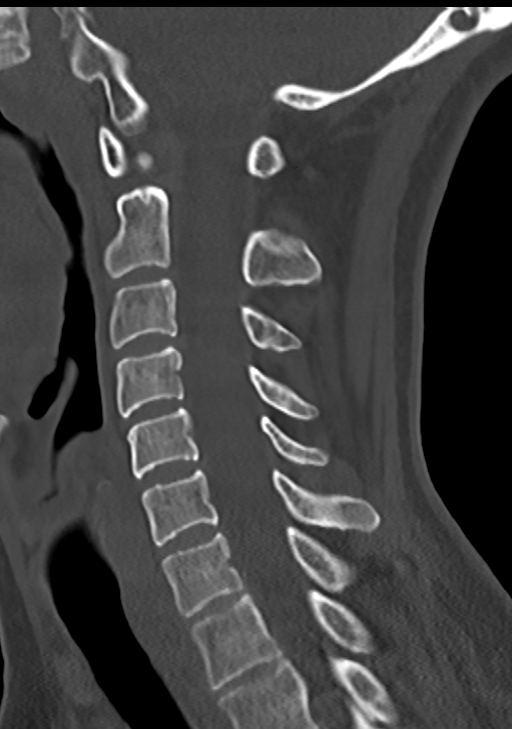
[im 31/62  soft-tissue]
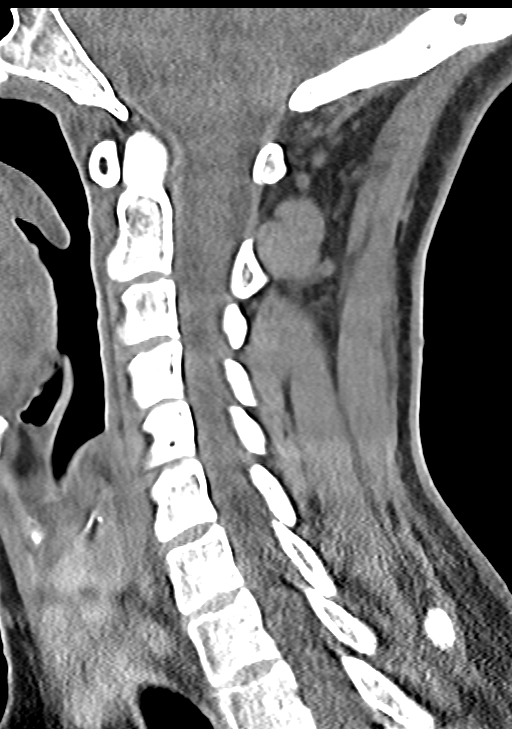
[im 31/62  bone]
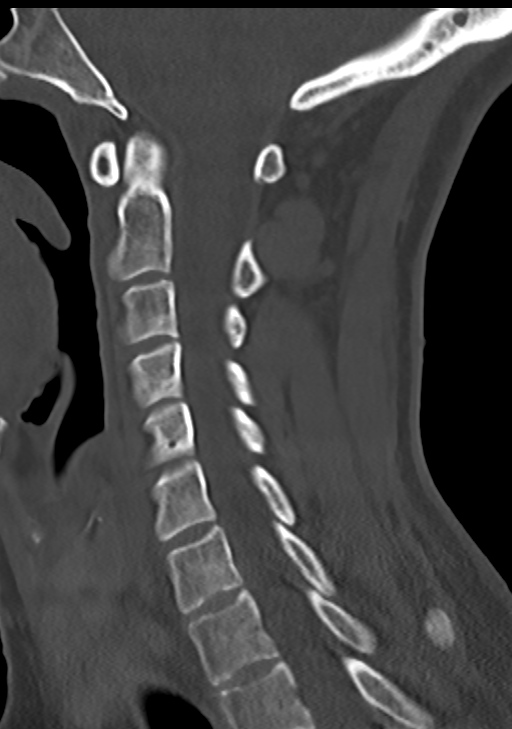
[im 36/62  bone]
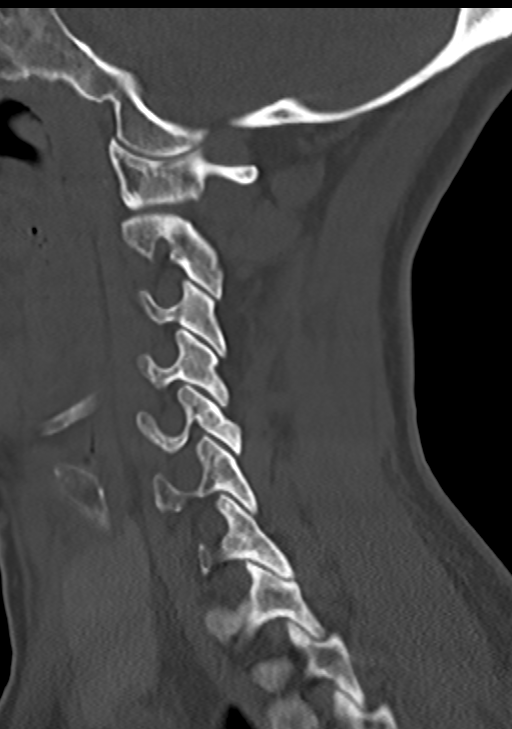
[im 41/62  bone]
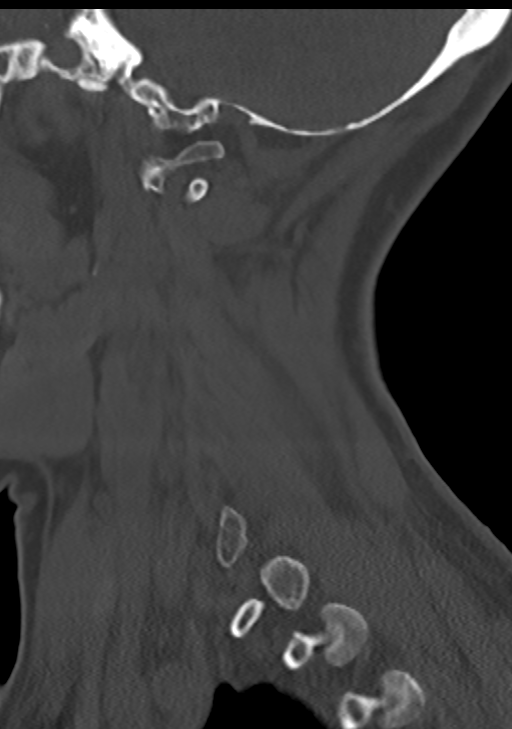

[Series 8: cor bone · coronal · 0.19mm/px · 3 of 44 slices shown]
[im 9/44  bone]
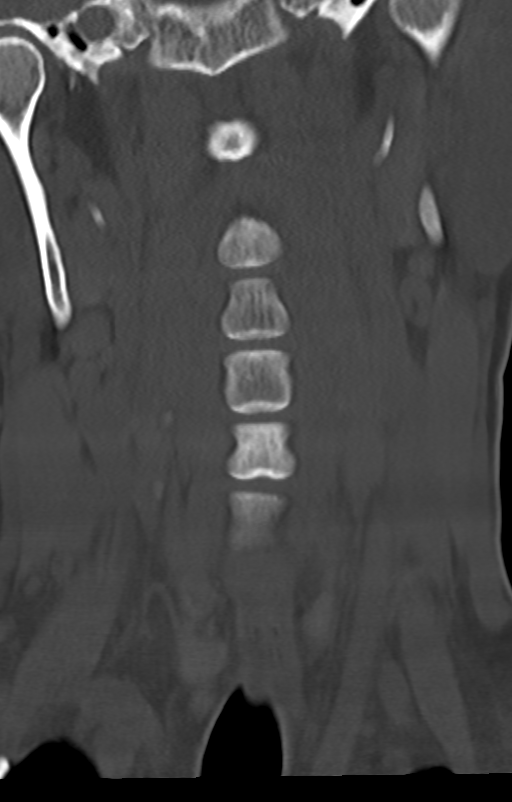
[im 18/44  bone]
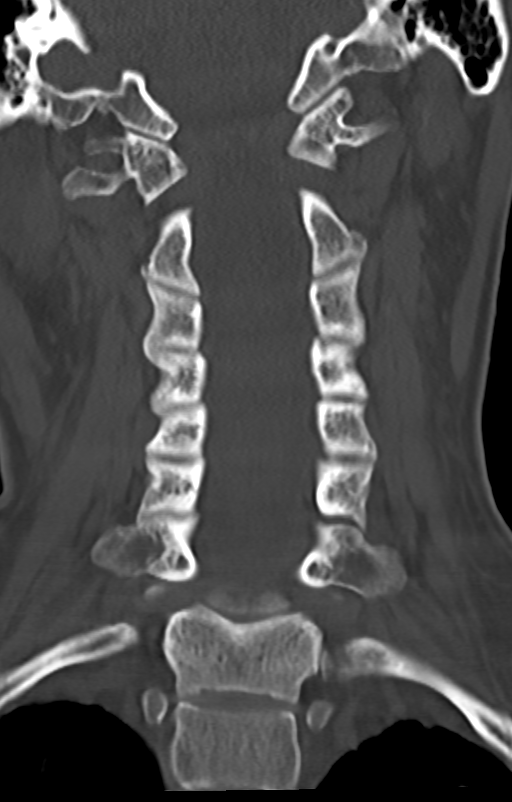
[im 26/44  bone]
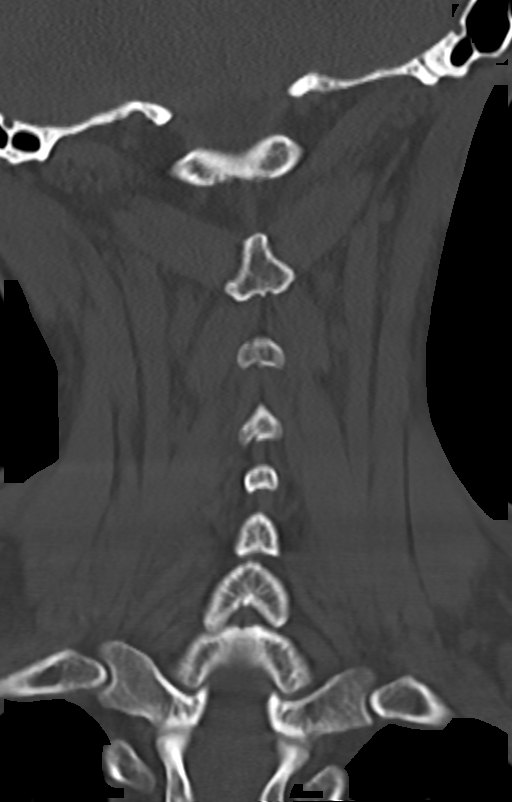

[Series 10: orthogonal axials · axial · 0.21mm/px · z∈[+1038,+1130]mm · 4 of 82 slices shown, 5 images]
[im 14/82  soft-tissue]
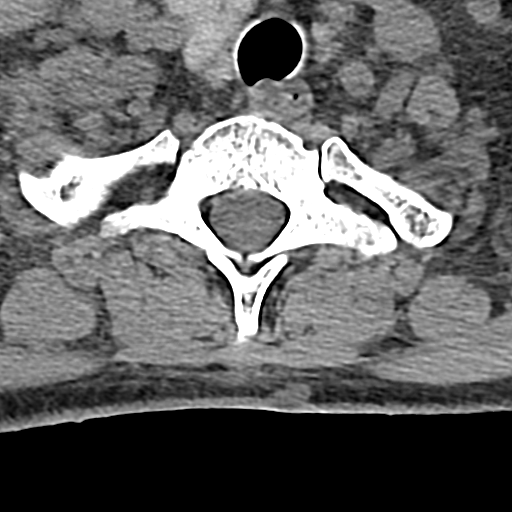
[im 14/82  bone]
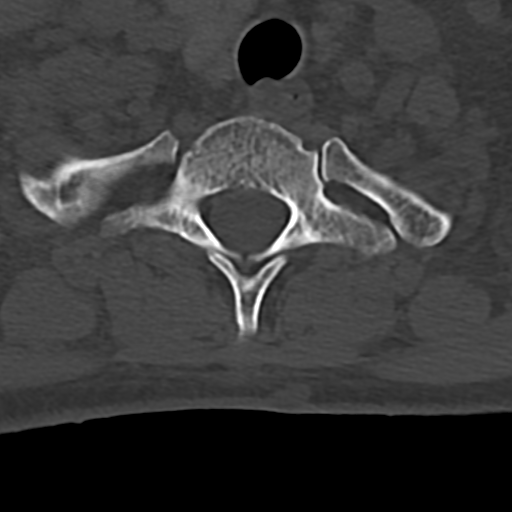
[im 28/82  bone]
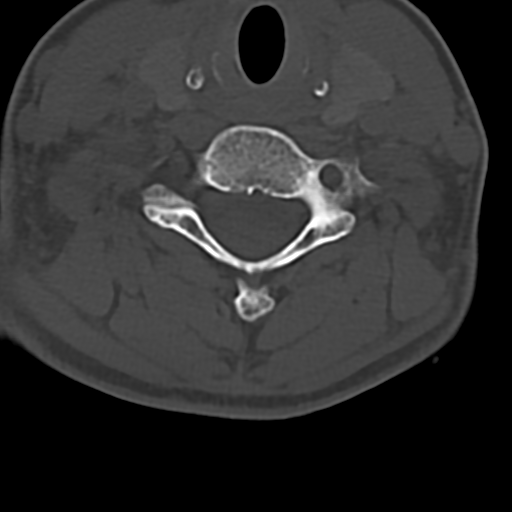
[im 55/82  bone]
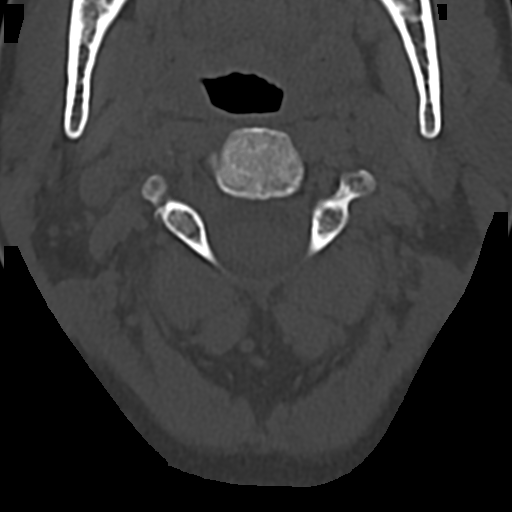
[im 68/82  bone]
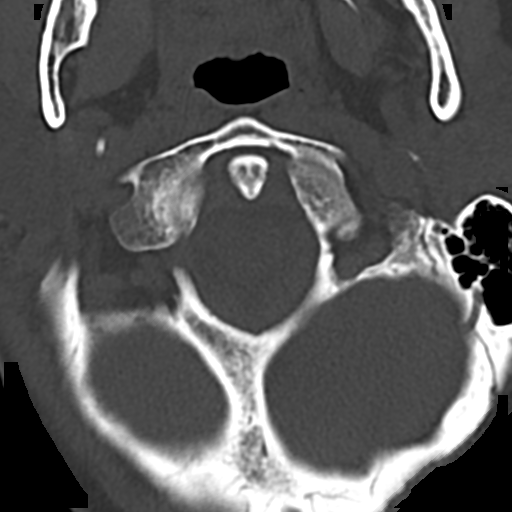

[12 of 33 positions shown; findings below may reference images not displayed]

FINDINGS: CT HEAD FINDINGS

Brain: Normal anatomic configuration. No abnormal intra or
extra-axial mass lesion or fluid collection. No abnormal mass effect
or midline shift. No evidence of acute intracranial hemorrhage or
infarct. Ventricular size is normal. Cerebellum unremarkable.

Vascular: Unremarkable

Skull: Intact

Sinuses/Orbits: Paranasal sinuses are clear. Orbits are
unremarkable.

Other: Mastoid air cells and middle ear cavities are clear.

CT CERVICAL SPINE FINDINGS

Alignment: Normal.  No listhesis.

Skull base and vertebrae: The craniocervical junction is
unremarkable. The atlantodental interval is normal. No acute
fracture of the cervical spine. No lytic or blastic bone lesion

Soft tissues and spinal canal: No prevertebral fluid or swelling. No
visible canal hematoma.

Disc levels: Sagittal reformats demonstrates preservation of normal
cervical lordosis. Vertebral body height and intervertebral disc
heights have been preserved. The spinal canal is widely patent. The
prevertebral soft tissues are not thickened. Review of the axial
images demonstrates no significant uncovertebral or facet arthrosis.
No significant neural foraminal narrowing or canal stenosis.

Upper chest: Unremarkable

Other: None significant
IMPRESSION: Normal examination. No acute intracranial injury. No calvarial
fracture. No acute fracture or listhesis of the cervical spine.
